# Patient Record
Sex: Male | Born: 1970 | Race: White | Hispanic: No | Marital: Married | Smoking: Current some day smoker
Health system: Southern US, Community
[De-identification: ages and names within clinical notes are randomized; demographics above are authoritative.]

## PROBLEM LIST (undated history)

## (undated) DIAGNOSIS — E7800 Pure hypercholesterolemia, unspecified: Secondary | ICD-10-CM

## (undated) DIAGNOSIS — I319 Disease of pericardium, unspecified: Secondary | ICD-10-CM

## (undated) DIAGNOSIS — I10 Essential (primary) hypertension: Secondary | ICD-10-CM

## (undated) HISTORY — PX: TONSILLECTOMY: SUR1361

## (undated) HISTORY — PX: OTHER SURGICAL HISTORY: SHX169

---

## 2016-05-29 DIAGNOSIS — I319 Disease of pericardium, unspecified: Secondary | ICD-10-CM

## 2016-05-29 HISTORY — DX: Disease of pericardium, unspecified: I31.9

## 2016-09-25 ENCOUNTER — Observation Stay (HOSPITAL_BASED_OUTPATIENT_CLINIC_OR_DEPARTMENT_OTHER)
Admission: EM | Admit: 2016-09-25 | Discharge: 2016-09-27 | Disposition: A | Discharge status: Home / Self Care | Payer: Managed Care, Other (non HMO) | Attending: Internal Medicine | Admitting: Internal Medicine

## 2016-09-25 ENCOUNTER — Emergency Department (HOSPITAL_BASED_OUTPATIENT_CLINIC_OR_DEPARTMENT_OTHER): Payer: Managed Care, Other (non HMO)

## 2016-09-25 ENCOUNTER — Encounter (HOSPITAL_BASED_OUTPATIENT_CLINIC_OR_DEPARTMENT_OTHER): Payer: Self-pay

## 2016-09-25 DIAGNOSIS — Z8249 Family history of ischemic heart disease and other diseases of the circulatory system: Secondary | ICD-10-CM | POA: Insufficient documentation

## 2016-09-25 DIAGNOSIS — I309 Acute pericarditis, unspecified: Secondary | ICD-10-CM | POA: Diagnosis not present

## 2016-09-25 DIAGNOSIS — E7800 Pure hypercholesterolemia, unspecified: Secondary | ICD-10-CM | POA: Diagnosis not present

## 2016-09-25 DIAGNOSIS — R110 Nausea: Secondary | ICD-10-CM | POA: Insufficient documentation

## 2016-09-25 DIAGNOSIS — R079 Chest pain, unspecified: Secondary | ICD-10-CM | POA: Diagnosis present

## 2016-09-25 DIAGNOSIS — E876 Hypokalemia: Secondary | ICD-10-CM | POA: Insufficient documentation

## 2016-09-25 DIAGNOSIS — F17210 Nicotine dependence, cigarettes, uncomplicated: Secondary | ICD-10-CM | POA: Insufficient documentation

## 2016-09-25 DIAGNOSIS — I10 Essential (primary) hypertension: Secondary | ICD-10-CM | POA: Diagnosis present

## 2016-09-25 DIAGNOSIS — E785 Hyperlipidemia, unspecified: Secondary | ICD-10-CM | POA: Diagnosis present

## 2016-09-25 HISTORY — DX: Pure hypercholesterolemia, unspecified: E78.00

## 2016-09-25 HISTORY — DX: Essential (primary) hypertension: I10

## 2016-09-25 LAB — CBC WITH DIFFERENTIAL/PLATELET
Basophils Absolute: 0 10*3/uL (ref 0.0–0.1)
Basophils Relative: 1 %
Eosinophils Absolute: 0.1 10*3/uL (ref 0.0–0.7)
Eosinophils Relative: 2 %
HEMATOCRIT: 42.1 % (ref 39.0–52.0)
HEMOGLOBIN: 14 g/dL (ref 13.0–17.0)
Lymphocytes Relative: 16 %
Lymphs Abs: 1.2 10*3/uL (ref 0.7–4.0)
MCH: 23.6 pg — AB (ref 26.0–34.0)
MCHC: 33.3 g/dL (ref 30.0–36.0)
MCV: 70.9 fL — AB (ref 78.0–100.0)
MONOS PCT: 17 %
Monocytes Absolute: 1.2 10*3/uL — ABNORMAL HIGH (ref 0.1–1.0)
NEUTROS PCT: 64 %
Neutro Abs: 4.6 10*3/uL (ref 1.7–7.7)
Platelets: 213 10*3/uL (ref 150–400)
RBC: 5.94 MIL/uL — ABNORMAL HIGH (ref 4.22–5.81)
RDW: 16.5 % — ABNORMAL HIGH (ref 11.5–15.5)
WBC: 7.1 10*3/uL (ref 4.0–10.5)

## 2016-09-25 LAB — BASIC METABOLIC PANEL
Anion gap: 9 (ref 5–15)
BUN: 16 mg/dL (ref 6–20)
CHLORIDE: 97 mmol/L — AB (ref 101–111)
CO2: 30 mmol/L (ref 22–32)
CREATININE: 1.09 mg/dL (ref 0.61–1.24)
Calcium: 9.8 mg/dL (ref 8.9–10.3)
GFR calc Af Amer: >60 mL/min (ref >60)
GFR calc non Af Amer: >60 mL/min (ref >60)
GLUCOSE: 184 mg/dL — AB (ref 65–99)
Potassium: 3.6 mmol/L (ref 3.5–5.1)
SODIUM: 136 mmol/L (ref 135–145)

## 2016-09-25 LAB — TROPONIN I: Troponin I: <0.03 ng/mL (ref <0.03)

## 2016-09-25 MED ORDER — ASPIRIN 81 MG PO CHEW
324.0000 mg | CHEWABLE_TABLET | Freq: Once | ORAL | Status: AC
  Administered 2016-09-25: 324 mg via ORAL
  Filled 2016-09-25: qty 4

## 2016-09-25 MED ORDER — MORPHINE SULFATE (PF) 4 MG/ML IV SOLN
4.0000 mg | Freq: Once | INTRAVENOUS | Status: AC
  Administered 2016-09-26: 4 mg via INTRAVENOUS
  Filled 2016-09-25: qty 1

## 2016-09-25 MED ORDER — NITROGLYCERIN 0.4 MG SL SUBL
0.4000 mg | SUBLINGUAL_TABLET | SUBLINGUAL | Status: DC | PRN
  Administered 2016-09-25: 0.4 mg via SUBLINGUAL
  Filled 2016-09-25: qty 1

## 2016-09-25 MED ORDER — MORPHINE SULFATE (PF) 4 MG/ML IV SOLN
4.0000 mg | Freq: Once | INTRAVENOUS | Status: AC
  Administered 2016-09-25: 4 mg via INTRAVENOUS
  Filled 2016-09-25: qty 1

## 2016-09-25 MED ORDER — GI COCKTAIL ~~LOC~~
30.0000 mL | Freq: Once | ORAL | Status: AC
  Administered 2016-09-26: 30 mL via ORAL
  Filled 2016-09-25: qty 30

## 2016-09-25 NOTE — ED Provider Notes (Signed)
[facility] Provider Note   CSN: [id number] Arrival date & time: 09/25/16  2158  By signing my name below, I, Freida Busman, attest that this documentation has been prepared under the direction and in the presence of Roxy Horseman, PA-C. Electronically Signed: Freida Busman, Scribe. 09/25/2016. 10:59 PM.  History   Chief Complaint Chief Complaint  Patient presents with  . Chest Pain    The history is provided by the patient. No language interpreter was used.     HPI Comments:  Mark Powell is a 46 y.o. male with a history of HTN and HLD, who presents to the Emergency Department complaining of gradually worsening, non-radiating, central CP that began ~ 1500 today. He describes his pain as a tightness. Pr reports associated SOB, mild DOE, HA, and nausea. He denies vomiting and LE swelling. No h/o same. No ASA taken PTA. No alleviating factors noted. He also denies h/o asthma/COPD and PE/DVT. Pt smokes less than 1 ppd.  Pt reports FHx of MI <55 yrs old--his older brother at age 28.    Past Medical History:  Diagnosis Date  . High cholesterol   . Hypertension     There are no active problems to display for this patient.   Past Surgical History:  Procedure Laterality Date  . arm surgery      Home Medications    Prior to Admission medications   Medication Sig Start Date End Date Taking? Authorizing Provider  atorvastatin (LIPITOR) 10 MG tablet Take 10 mg by mouth daily.   Yes Historical Provider, MD  losartan-hydrochlorothiazide (HYZAAR) 50-12.5 MG tablet Take 1 tablet by mouth daily.   Yes Historical Provider, MD  Vitamin D, Ergocalciferol, (DRISDOL) 50000 units CAPS capsule Take 50,000 Units by mouth every 7 (seven) days.   Yes Historical Provider, MD    Family History No family history on file.  Social History Social History  Substance Use Topics  . Smoking status: Current Every Day Smoker  . Smokeless tobacco: Never Used  . Alcohol use Yes   Comment: daily     Allergies   Patient has no known allergies.   Review of Systems Review of Systems  Respiratory: Positive for shortness of breath.   Cardiovascular: Positive for chest pain. Negative for leg swelling.  Gastrointestinal: Positive for nausea. Negative for vomiting.  Neurological: Positive for headaches.  All other systems reviewed and are negative.  Physical Exam Updated Vital Signs BP 122/77 (BP Location: Left Arm)   Pulse 94   Temp 97.9 F (36.6 C) (Oral)   Resp 20   Ht  (1.88 m)   Wt 180 lb (81.6 kg)   SpO2 99%   BMI 23.11 kg/m   Physical Exam  Constitutional: He is oriented to person, place, and time. He appears well-developed and well-nourished. No distress.  HENT:  Head: Normocephalic and atraumatic.  Eyes: Conjunctivae and EOM are normal.  Neck: Normal range of motion.  Cardiovascular: Normal rate, regular rhythm, normal heart sounds and intact distal pulses.   Pulmonary/Chest: Effort normal and breath sounds normal. No respiratory distress.  Abdominal: Soft. He exhibits no distension. There is no tenderness.  Musculoskeletal: Normal range of motion.  Neurological: He is alert and oriented to person, place, and time.  Skin: Skin is warm and dry.  Psychiatric: He has a normal mood and affect. Judgment normal.  Nursing note and vitals reviewed.   ED Treatments / Results  DIAGNOSTIC STUDIES:  Oxygen Saturation is 99% on RA, normal by my  interpretation.    COORDINATION OF CARE:  10:58 PM Discussed treatment plan with pt at bedside and pt agreed to plan.  Labs (all labs ordered are listed, but only abnormal results are displayed) Labs Reviewed - No data to display  EKG  EKG Interpretation  Date/Time:  Monday September 25 2016 22:10:50 EDT Ventricular Rate:  95 PR Interval:  140 QRS Duration: 82 QT Interval:  352 QTC Calculation: 442 R Axis:   76 Text Interpretation:  Normal sinus rhythm Confirmed by [facility]  MD, APRIL  ([id number]) on 09/25/2016 11:44:15 PM       Radiology Dg Chest 2 View  Result Date: 09/25/2016 CLINICAL DATA:  Left-sided chest pain EXAM: CHEST  2 VIEW COMPARISON:  None. FINDINGS: The heart size and mediastinal contours are within normal limits. Both lungs are clear. The visualized skeletal structures are unremarkable. IMPRESSION: No active cardiopulmonary disease. Electronically Signed   By: Jasmine Pang M.D.   On: 09/25/2016 22:41    Procedures Procedures (including critical care time)  Medications Ordered in ED Medications - No data to display   Initial Impression / Assessment and Plan / ED Course  I have reviewed the triage vital signs and the nursing notes.  Pertinent labs & imaging results that were available during my care of the patient were reviewed by me and considered in my medical decision making (see chart for details).     Patient with chest pain which is central, associated shortness of breath, worse with exertion, associated nausea. Symptoms have now resolved with treatment with aspirin, morphine, nitroglycerin. I do not suspect PE. Patient is PERC negative.  He has a heart score of 4. Cardiac risk factors are hypertension, hyperlipidemia, smoking history, and early family history of coronary artery disease. He has a negative troponin and his EKG shows no ischemic changes, however given risk factors and history, feel that cardiac rule out is indicated. Patient seen by and discussed with Dr. Nicanor Alcon, who agrees with the plan.  Appreciate Dr. Kara Pacer for accepting patient to [facility].  Final Clinical Impressions(s) / ED Diagnoses   Final diagnoses:  Chest pain with high risk for cardiac etiology    New Prescriptions New Prescriptions   No medications on file   I personally performed the services described in this documentation, which was scribed in my presence. The recorded information has been reviewed and is accurate.       Roxy Horseman, PA-C 09/26/16  0021    Roxy Horseman, PA-C 09/26/16 0022    Cy Blamer, MD 09/26/16 [phone]

## 2016-09-25 NOTE — ED Notes (Signed)
2nd NTG held due to BP, fluids hung and MD notified

## 2016-09-25 NOTE — ED Notes (Signed)
Pt not in room, pt in xray.  

## 2016-09-25 NOTE — ED Triage Notes (Addendum)
c/o CP/SOB-pain worse with deep breaths-NAD-steady gait

## 2016-09-26 ENCOUNTER — Encounter (HOSPITAL_COMMUNITY): Payer: Self-pay | Admitting: Internal Medicine

## 2016-09-26 ENCOUNTER — Encounter (HOSPITAL_COMMUNITY)
Admission: EM | Disposition: A | Discharge status: Home / Self Care | Payer: Self-pay | Source: Home / Self Care | Attending: Emergency Medicine

## 2016-09-26 DIAGNOSIS — R079 Chest pain, unspecified: Secondary | ICD-10-CM

## 2016-09-26 DIAGNOSIS — I200 Unstable angina: Secondary | ICD-10-CM | POA: Diagnosis not present

## 2016-09-26 DIAGNOSIS — I10 Essential (primary) hypertension: Secondary | ICD-10-CM | POA: Diagnosis present

## 2016-09-26 DIAGNOSIS — R072 Precordial pain: Secondary | ICD-10-CM

## 2016-09-26 DIAGNOSIS — E7800 Pure hypercholesterolemia, unspecified: Secondary | ICD-10-CM | POA: Diagnosis not present

## 2016-09-26 DIAGNOSIS — E785 Hyperlipidemia, unspecified: Secondary | ICD-10-CM

## 2016-09-26 HISTORY — PX: LEFT HEART CATH AND CORONARY ANGIOGRAPHY: CATH118249

## 2016-09-26 LAB — HIV ANTIBODY (ROUTINE TESTING W REFLEX): HIV Screen 4th Generation wRfx: NONREACTIVE

## 2016-09-26 LAB — PROTIME-INR
INR: 1.07
Prothrombin Time: 13.9 seconds (ref 11.4–15.2)

## 2016-09-26 LAB — TROPONIN I
Troponin I: <0.03 ng/mL (ref <0.03)
Troponin I: <0.03 ng/mL (ref <0.03)

## 2016-09-26 SURGERY — LEFT HEART CATH AND CORONARY ANGIOGRAPHY
Anesthesia: LOCAL

## 2016-09-26 MED ORDER — ACETAMINOPHEN 325 MG PO TABS
650.0000 mg | ORAL_TABLET | ORAL | Status: DC | PRN
  Administered 2016-09-26: 650 mg via ORAL
  Filled 2016-09-26: qty 2

## 2016-09-26 MED ORDER — MIDAZOLAM HCL 2 MG/2ML IJ SOLN
INTRAMUSCULAR | Status: AC
  Filled 2016-09-26: qty 2

## 2016-09-26 MED ORDER — HEPARIN SODIUM (PORCINE) 1000 UNIT/ML IJ SOLN
INTRAMUSCULAR | Status: DC | PRN
  Administered 2016-09-26: 4000 [IU] via INTRAVENOUS

## 2016-09-26 MED ORDER — MIDAZOLAM HCL 2 MG/2ML IJ SOLN
INTRAMUSCULAR | Status: DC | PRN
  Administered 2016-09-26: 2 mg via INTRAVENOUS

## 2016-09-26 MED ORDER — FENTANYL CITRATE (PF) 100 MCG/2ML IJ SOLN
INTRAMUSCULAR | Status: DC | PRN
  Administered 2016-09-26: 50 ug via INTRAVENOUS

## 2016-09-26 MED ORDER — SODIUM CHLORIDE 0.9% FLUSH
3.0000 mL | INTRAVENOUS | Status: DC | PRN
Start: 2016-09-26 — End: 2016-09-26

## 2016-09-26 MED ORDER — FENTANYL CITRATE (PF) 100 MCG/2ML IJ SOLN
INTRAMUSCULAR | Status: AC
  Filled 2016-09-26: qty 2

## 2016-09-26 MED ORDER — VERAPAMIL HCL 2.5 MG/ML IV SOLN
INTRAVENOUS | Status: DC | PRN
  Administered 2016-09-26: 18:00:00 via INTRA_ARTERIAL

## 2016-09-26 MED ORDER — SODIUM CHLORIDE 0.9% FLUSH: 3.0000 mL | Freq: Two times a day (BID) | INTRAVENOUS | Status: DC

## 2016-09-26 MED ORDER — SODIUM CHLORIDE 0.9 % WEIGHT BASED INFUSION: 1.0000 mL/kg/h | INTRAVENOUS | Status: DC

## 2016-09-26 MED ORDER — LIDOCAINE HCL (PF) 1 % IJ SOLN
INTRAMUSCULAR | Status: DC | PRN
  Administered 2016-09-26: 2 mL

## 2016-09-26 MED ORDER — SODIUM CHLORIDE 0.9 % IV SOLN: 250.0000 mL | INTRAVENOUS | Status: DC | PRN

## 2016-09-26 MED ORDER — ATORVASTATIN CALCIUM 10 MG PO TABS: 10.0000 mg | ORAL_TABLET | Freq: Every day | ORAL | Status: DC

## 2016-09-26 MED ORDER — SODIUM CHLORIDE 0.9% FLUSH: 3.0000 mL | INTRAVENOUS | Status: DC | PRN

## 2016-09-26 MED ORDER — SODIUM CHLORIDE 0.9 % WEIGHT BASED INFUSION: 3.0000 mL/kg/h | INTRAVENOUS | Status: DC

## 2016-09-26 MED ORDER — IOPAMIDOL (ISOVUE-370) INJECTION 76%
INTRAVENOUS | Status: DC | PRN
  Administered 2016-09-26: 75 mL via INTRA_ARTERIAL

## 2016-09-26 MED ORDER — ONDANSETRON HCL 4 MG/2ML IJ SOLN: 4.0000 mg | Freq: Four times a day (QID) | INTRAMUSCULAR | Status: DC | PRN

## 2016-09-26 MED ORDER — HYDROCHLOROTHIAZIDE 12.5 MG PO CAPS
12.5000 mg | ORAL_CAPSULE | Freq: Every day | ORAL | Status: DC
  Administered 2016-09-26 – 2016-09-27 (×2): 12.5 mg via ORAL
  Filled 2016-09-26 (×2): qty 1

## 2016-09-26 MED ORDER — ASPIRIN 81 MG PO CHEW: 81.0000 mg | CHEWABLE_TABLET | ORAL | Status: DC

## 2016-09-26 MED ORDER — CHLORHEXIDINE GLUCONATE 4 % EX LIQD
CUTANEOUS | Status: AC
  Filled 2016-09-26: qty 120

## 2016-09-26 MED ORDER — VERAPAMIL HCL 2.5 MG/ML IV SOLN
INTRAVENOUS | Status: AC
  Filled 2016-09-26: qty 2

## 2016-09-26 MED ORDER — ASPIRIN EC 81 MG PO TBEC
81.0000 mg | DELAYED_RELEASE_TABLET | Freq: Every day | ORAL | Status: DC
  Administered 2016-09-27: 81 mg via ORAL
  Filled 2016-09-26: qty 1

## 2016-09-26 MED ORDER — LOSARTAN POTASSIUM 50 MG PO TABS
50.0000 mg | ORAL_TABLET | Freq: Every day | ORAL | Status: DC
  Administered 2016-09-26 – 2016-09-27 (×3): 50 mg via ORAL
  Filled 2016-09-26 (×3): qty 1

## 2016-09-26 MED ORDER — HEPARIN (PORCINE) IN NACL 2-0.9 UNIT/ML-% IJ SOLN
INTRAMUSCULAR | Status: AC
  Filled 2016-09-26: qty 1000

## 2016-09-26 MED ORDER — HEPARIN SODIUM (PORCINE) 1000 UNIT/ML IJ SOLN
INTRAMUSCULAR | Status: AC
  Filled 2016-09-26: qty 1

## 2016-09-26 MED ORDER — ENOXAPARIN SODIUM 40 MG/0.4ML ~~LOC~~ SOLN
40.0000 mg | SUBCUTANEOUS | Status: DC
  Administered 2016-09-26 – 2016-09-27 (×2): 40 mg via SUBCUTANEOUS
  Filled 2016-09-26 (×2): qty 0.4

## 2016-09-26 MED ORDER — LIDOCAINE HCL (PF) 1 % IJ SOLN
INTRAMUSCULAR | Status: AC
  Filled 2016-09-26: qty 30

## 2016-09-26 MED ORDER — SODIUM CHLORIDE 0.9 % WEIGHT BASED INFUSION: 1.0000 mL/kg/h | INTRAVENOUS | Status: AC

## 2016-09-26 MED ORDER — LOSARTAN POTASSIUM-HCTZ 50-12.5 MG PO TABS: 1.0000 | ORAL_TABLET | Freq: Every day | ORAL | Status: DC

## 2016-09-26 MED ORDER — DIAZEPAM 5 MG PO TABS: 5.0000 mg | ORAL_TABLET | ORAL | Status: DC | PRN

## 2016-09-26 MED ORDER — ACETAMINOPHEN 325 MG PO TABS: 650.0000 mg | ORAL_TABLET | ORAL | Status: DC | PRN

## 2016-09-26 MED ORDER — HEPARIN (PORCINE) IN NACL 2-0.9 UNIT/ML-% IJ SOLN
INTRAMUSCULAR | Status: DC | PRN
  Administered 2016-09-26: 1000 mL

## 2016-09-26 SURGICAL SUPPLY — 11 items

## 2016-09-26 NOTE — Interval H&P Note (Signed)
History and Physical Interval Note:  09/26/2016 6:17 PM  Mark Powell  has presented today for surgery, with the diagnosis of cp  The various methods of treatment have been discussed with the patient and family. After consideration of risks, benefits and other options for treatment, the patient has consented to  Procedure(s): Left Heart Cath and Coronary Angiography (N/A) as a surgical intervention .  The patient's history has been reviewed, patient examined, no change in status, stable for surgery.  I have reviewed the patient's chart and labs.  Questions were answered to the patient's satisfaction.     [name]

## 2016-09-26 NOTE — Progress Notes (Signed)
Pt in cath holding via [facility]. BP 163/113, NSR at 89, 100% saturated on room air. Awaiting cardiac cath.

## 2016-09-26 NOTE — H&P (Signed)
History and Physical    Mark Powell ZOX:[id number] DOB: [birth year] DOA: 09/25/2016  PCP: [facility]  Patient coming from: Home  I have personally briefly reviewed patient's old medical records in [facility]  Chief Complaint: Chest pain  HPI: Mark Powell is a 46 y.o. male with medical history significant of HLD, HTN, smoking, brother with MI at age 57 or 27.  Patient presents to the ED at [facility] with c/o chest pain.  Sudden onset of pain in L chest at 3pm today.  Associated nausea and SOB, worse with exertion better at rest.  NTG dropped his blood pressure.  Got ASA, got morphine, CP resolved.     ED Course: Trop Nl.  No prior EKG available on system for comparison, but on my read the patient has T wave inversions in lead III and T wave flattening in AVF.   Review of Systems: As per HPI otherwise 10 point review of systems negative.   Past Medical History:  Diagnosis Date  . High cholesterol   . Hypertension     Past Surgical History:  Procedure Laterality Date  . arm surgery       reports that he has been smoking.  He has never used smokeless tobacco. He reports that he drinks alcohol. He reports that he uses drugs, including Marijuana.  No Known Allergies  Family History  Problem Relation Age of Onset  . Heart attack Brother 47     Prior to Admission medications   Medication Sig Start Date End Date Taking? Authorizing Provider  atorvastatin (LIPITOR) 10 MG tablet Take 10 mg by mouth daily.   Yes Historical Provider, MD  losartan-hydrochlorothiazide (HYZAAR) 50-12.5 MG tablet Take 1 tablet by mouth daily.   Yes Historical Provider, MD  Vitamin D, Ergocalciferol, (DRISDOL) 50000 units CAPS capsule Take 50,000 Units by mouth every 7 (seven) days.   Yes Historical Provider, MD    Physical Exam: Vitals:   09/26/16 0130 09/26/16 0215 09/26/16 0258 09/26/16 0359  BP: 130/89 (!) 132/93 114/84 124/84  Pulse: 87 91 90 84  Resp: Temp:   98.1 F (36.7 C)  98.6 F (37 C)  TempSrc:  Oral  Oral  SpO2: 97% 97% 98% 100%  Weight:      Height:        Constitutional: NAD, calm, comfortable Eyes: PERRL, lids and conjunctivae normal ENMT: Mucous membranes are moist. Posterior pharynx clear of any exudate or lesions.Normal dentition.  Neck: normal, supple, no masses, no thyromegaly Respiratory: clear to auscultation bilaterally, no wheezing, no crackles. Normal respiratory effort. No accessory muscle use.  Cardiovascular: Regular rate and rhythm, no murmurs / rubs / gallops. No extremity edema. 2+ pedal pulses. No carotid bruits.  Abdomen: no tenderness, no masses palpated. No hepatosplenomegaly. Bowel sounds positive.  Musculoskeletal: no clubbing / cyanosis. No joint deformity upper and lower extremities. Good ROM, no contractures. Normal muscle tone.  Skin: no rashes, lesions, ulcers. No induration Neurologic: CN 2-12 grossly intact. Sensation intact, DTR normal. Strength 5/5 in all 4.  Psychiatric: Normal judgment and insight. Alert and oriented x 3. Normal mood.    Labs on Admission: I have personally reviewed following labs and imaging studies  CBC:  Recent Labs Lab 09/25/16 2253  WBC 7.1  NEUTROABS 4.6  HGB 14.0  HCT 42.1  MCV 70.9*  PLT 213   Basic Metabolic Panel:  Recent Labs Lab 09/25/16 2253  NA 136  K 3.6  CL 97*  CO2 30  GLUCOSE 184*  BUN 16  CREATININE 1.09  CALCIUM 9.8   GFR: Estimated Creatinine Clearance: 98.8 mL/min (by C-G formula based on SCr of 1.09 mg/dL). Liver Function Tests: No results for input(s): AST, ALT, ALKPHOS, BILITOT, PROT, ALBUMIN in the last 168 hours. No results for input(s): LIPASE, AMYLASE in the last 168 hours. No results for input(s): AMMONIA in the last 168 hours. Coagulation Profile: No results for input(s): INR, PROTIME in the last 168 hours. Cardiac Enzymes:  Recent Labs Lab 09/25/16 2254  TROPONINI <0.03   BNP (last 3 results) No results for  input(s): PROBNP in the last 8760 hours. HbA1C: No results for input(s): HGBA1C in the last 72 hours. CBG: No results for input(s): GLUCAP in the last 168 hours. Lipid Profile: No results for input(s): CHOL, HDL, LDLCALC, TRIG, CHOLHDL, LDLDIRECT in the last 72 hours. Thyroid Function Tests: No results for input(s): TSH, T4TOTAL, FREET4, T3FREE, THYROIDAB in the last 72 hours. Anemia Panel: No results for input(s): VITAMINB12, FOLATE, FERRITIN, TIBC, IRON, RETICCTPCT in the last 72 hours. Urine analysis: No results found for: COLORURINE, APPEARANCEUR, LABSPEC, PHURINE, GLUCOSEU, HGBUR, BILIRUBINUR, KETONESUR, PROTEINUR, UROBILINOGEN, NITRITE, LEUKOCYTESUR  Radiological Exams on Admission: Dg Chest 2 View  Result Date: 09/25/2016 CLINICAL DATA:  Left-sided chest pain EXAM: CHEST  2 VIEW COMPARISON:  None. FINDINGS: The heart size and mediastinal contours are within normal limits. Both lungs are clear. The visualized skeletal structures are unremarkable. IMPRESSION: No active cardiopulmonary disease. Electronically Signed   By: Jasmine Pang M.D.   On: 09/25/2016 22:41    EKG: Independently reviewed. Patient appears to have T wave inversions in lead III and T wave flattening in AVF  Assessment/Plan Principal Problem:   Chest pain Active Problems:   HLD (hyperlipidemia)   HTN (hypertension)    1. Chest pain - 1. CP obs pathway 2. Tele monitor 3. Serial trops 4. NPO 5. Call cards in AM re: stress test vs heart cath 6. Heart score is 5 by my math, (giving him an additional point for the EKG findings as noted above). 2. HTN - continue home BP med 3. HLD - continue statin  DVT prophylaxis: Lovenox Code Status: Full Family Communication: No family in room Disposition Plan: Home after admit Consults called: None Admission status: Place in obs   GARDNER, Heywood Iles. DO [facility] Pager [phone]  If 7AM-7PM, please contact day team taking care of  patient [email or web address] Password [email or web address]  09/26/2016, 4:09 AM

## 2016-09-26 NOTE — Progress Notes (Signed)
TR BAND REMOVAL  LOCATION:    right radial  DEFLATED PER PROTOCOL:    Yes.    TIME BAND OFF / DRESSING APPLIED:    21:30   SITE UPON ARRIVAL:    Level 0  SITE AFTER BAND REMOVAL:    Level 0  CIRCULATION SENSATION AND MOVEMENT:    Within Normal Limits  YES   COMMENTS:   Post TR band instructions given. Pt tolerated well.

## 2016-09-26 NOTE — Consult Note (Signed)
Patient ID: Mckinnon Glick MRN: [id number], DOB/AGE: Nov 04, 1970   Admit date: 09/25/2016  Reason for Consult: Chest Pain  Requesting Physician: Dr. Lyda Perone, Internal Medicine    Primary Physician: [facility] Primary Cardiologist: [facility] (Dr. Mayford Knife)   Pt. Profile:  Mark Powell is a 46 y.o. male with multiple cardiac risk factors including h/o HTN, HLD, tobacco abuse x 30 years and strong family h/o CAD (brother with MI in late 24s) who is being seen today for the evaluation of chest pain at the request of Dr. Lyda Perone, Internal Medicine.   Problem List  Past Medical History:  Diagnosis Date  . High cholesterol   . Hypertension     Past Surgical History:  Procedure Laterality Date  . arm surgery       Allergies  No Known Allergies  HPI  Mark Powell is a 46 y.o. male with multiple cardiac risk factors including h/o HTN, HLD, tobacco abuse x 30 years and strong family h/o CAD (brother with MI in late 58s) who is being seen today for the evaluation of chest pain at the request of Dr. Lyda Perone, Internal Medicine.   He admits that he started smoking at the age of 84. Smokes 1/2 ppd. He takes meds for BP and cholesterol and reports full compliance. No known h/o DM. He works as a Emergency planning/management officer.    He was in his usual state of health until yesterday afternoon. He had lunch, [place]. Was at work and several hours later around 3:30 he developed resting SSCP described as pressure/tightness like someone sitting on his chest. Radiated over the the left side. He had associated diaphoresis and nausea w/o vomiting and later developed exertional dyspnea. His CP also worsened some with exertion. He has never had pain like this before. Symptoms persisted, prompting him to come to the ED. On arrival, EKG showed NSR with TWI in lead III. No prior EKGs for comparison. He was given ASA, SL NTG and morphine and pain improved, however he has had recurrence.  Currently with 6/10 CP. Cardiac enzymes are negative x 3. RN obtaining repeat EKG now.   Home Medications  Prior to Admission medications   Medication Sig Start Date End Date Taking? Authorizing Provider  atorvastatin (LIPITOR) 10 MG tablet Take 10 mg by mouth daily.   Yes Historical Provider, MD  losartan-hydrochlorothiazide (HYZAAR) 50-12.5 MG tablet Take 1 tablet by mouth daily.   Yes Historical Provider, MD  Vitamin D, Ergocalciferol, (DRISDOL) 50000 units CAPS capsule Take 50,000 Units by mouth every 7 (seven) days.   Yes Historical Provider, MD    Hospital Medications  . atorvastatin  10 mg Oral q1800  . enoxaparin (LOVENOX) injection  40 mg Subcutaneous Q24H  . losartan  50 mg Oral Daily   And  . hydrochlorothiazide  12.5 mg Oral Daily    acetaminophen, nitroGLYCERIN, ondansetron (ZOFRAN) IV  Family History  Family History  Problem Relation Age of Onset  . Heart attack Brother 52    Social History  Social History   Social History  . Marital status: Divorced    Spouse name: N/A  . Number of children: N/A  . Years of education: N/A   Occupational History  . Not on file.   Social History Main Topics  . Smoking status: Current Every Day Smoker  . Smokeless tobacco: Never Used  . Alcohol use Yes     Comment: daily  . Drug use: Yes    Types: Marijuana  .  Sexual activity: Not on file   Other Topics Concern  . Not on file   Social History Narrative  . No narrative on file     Review of Systems General:  No chills, fever, night sweats or weight changes.  Cardiovascular:  + chest pain, + dyspnea on exertion, no edema, orthopnea, palpitations, paroxysmal nocturnal dyspnea. Dermatological: No rash, lesions/masses Respiratory: No cough, dyspnea Urologic: No hematuria, dysuria Abdominal:   No nausea, vomiting, diarrhea, bright red blood per rectum, melena, or hematemesis Neurologic:  No visual changes, wkns, changes in mental status. All other systems  reviewed and are otherwise negative except as noted above.  Physical Exam  Blood pressure 124/84, pulse 84, temperature 98.6 F (37 C), temperature source Oral, resp. rate 17, height  (1.88 m), weight 177 lb 11.1 oz (80.6 kg), SpO2 100 %.  General: Pleasant, NAD Psych: Normal affect. Neuro: Alert and oriented X 3. Moves all extremities spontaneously. HEENT: Normal  Neck: Supple without bruits or JVD. Lungs:  Resp regular and unlabored, CTA. Heart: RRR no s3, s4, or murmurs. Abdomen: Soft, non-tender, non-distended, BS + x 4.  Extremities: No clubbing, cyanosis or edema. DP/PT/Radials 2+ and equal bilaterally.  Labs  Troponin (Point of Care Test) No results for input(s): TROPIPOC in the last 72 hours.  Recent Labs  09/25/16 2254 09/26/16 0405 09/26/16 0759  TROPONINI <0.03 <0.03 <0.03   Lab Results  Component Value Date   WBC 7.1 09/25/2016   HGB 14.0 09/25/2016   HCT 42.1 09/25/2016   MCV 70.9 (L) 09/25/2016   PLT 213 09/25/2016    Recent Labs Lab 09/25/16 2253  NA 136  K 3.6  CL 97*  CO2 30  BUN 16  CREATININE 1.09  CALCIUM 9.8  GLUCOSE 184*   No results found for: CHOL, HDL, LDLCALC, TRIG No results found for: DDIMER   Radiology/Studies  Dg Chest 2 View  Result Date: 09/25/2016 CLINICAL DATA:  Left-sided chest pain EXAM: CHEST  2 VIEW COMPARISON:  None. FINDINGS: The heart size and mediastinal contours are within normal limits. Both lungs are clear. The visualized skeletal structures are unremarkable. IMPRESSION: No active cardiopulmonary disease. Electronically Signed   By: Jasmine Pang M.D.   On: 09/25/2016 22:41    ECG  NSR with TWI in lead III. No prior EKGs for comparison-- personally reviewed  Telemetry  NSR 92 -- personally reviewed    ASSESSMENT AND PLAN  1. Unstable Angina: Mark Powell is a 46 y.o. male with multiple cardiac risk factors including h/o HTN, HLD, tobacco abuse x 30 years and strong family h/o CAD (brother with MI  in late 72s) who is being seen today for the evaluation of chest pain at the request of Dr. Lyda Perone, Internal Medicine. Symptomology is concerning for unstable angina. New onset SSCP occurring at rest and worse with exertion. Also with exertional dyspnea. Initial EKG with NSR with TWI in lead III. No prior EKGs for comparison. Cardiac enzymes are negative x 3. We are obtaining repeat EKG currently given recurrent CP. Will given additional PRN SL NTG given good response in the ED last night. Given his risk factors and symptoms, would recommend definitive LHC. Keep NPO. May need to add IV heparin until cath.  Continue statin. Check FLP. Add ASA. Will need BB if CAD, if HR and BP allows. MD to assess and will provide further recommendations.     Signed, Robbie Lis, PA-C, [facility] 09/26/2016, 9:42 AM [facility] Pager: [phone]

## 2016-09-26 NOTE — Progress Notes (Signed)
  PROGRESS NOTE  Patient admitted earlier this morning. See H&P. Patient complains of bilateral chest pressure "like someone is standing on me." Also associated symptoms of nausea without vomiting, cold sweats, shortness of breath. This started yesterday, relieved with nitro/morphine/aspirin. He had a stress test about 5 years ago which were normal. Cardiology consulted.   Noralee Stain, DO [facility] [email or web address] Password [facility] 09/26/2016, 9:47 AM

## 2016-09-26 NOTE — H&P (View-Only) (Signed)
Patient ID: Mark Powell MRN: [id number], DOB/AGE: Nov 04, 1970   Admit date: 09/25/2016  Reason for Consult: Chest Pain  Requesting Physician: Dr. Lyda Perone, Internal Medicine    Primary Physician: [facility] Primary Cardiologist: [facility] (Dr. Mayford Knife)   Pt. Profile:  Mark Powell is a 46 y.o. male with multiple cardiac risk factors including h/o HTN, HLD, tobacco abuse x 30 years and strong family h/o CAD (brother with MI in late 24s) who is being seen today for the evaluation of chest pain at the request of Dr. Lyda Perone, Internal Medicine.   Problem List  Past Medical History:  Diagnosis Date  . High cholesterol   . Hypertension     Past Surgical History:  Procedure Laterality Date  . arm surgery       Allergies  No Known Allergies  HPI  Mark Powell is a 46 y.o. male with multiple cardiac risk factors including h/o HTN, HLD, tobacco abuse x 30 years and strong family h/o CAD (brother with MI in late 58s) who is being seen today for the evaluation of chest pain at the request of Dr. Lyda Perone, Internal Medicine.   He admits that he started smoking at the age of 84. Smokes 1/2 ppd. He takes meds for BP and cholesterol and reports full compliance. No known h/o DM. He works as a Emergency planning/management officer.    He was in his usual state of health until yesterday afternoon. He had lunch, [place]. Was at work and several hours later around 3:30 he developed resting SSCP described as pressure/tightness like someone sitting on his chest. Radiated over the the left side. He had associated diaphoresis and nausea w/o vomiting and later developed exertional dyspnea. His CP also worsened some with exertion. He has never had pain like this before. Symptoms persisted, prompting him to come to the ED. On arrival, EKG showed NSR with TWI in lead III. No prior EKGs for comparison. He was given ASA, SL NTG and morphine and pain improved, however he has had recurrence.  Currently with 6/10 CP. Cardiac enzymes are negative x 3. RN obtaining repeat EKG now.   Home Medications  Prior to Admission medications   Medication Sig Start Date End Date Taking? Authorizing Provider  atorvastatin (LIPITOR) 10 MG tablet Take 10 mg by mouth daily.   Yes Historical Provider, MD  losartan-hydrochlorothiazide (HYZAAR) 50-12.5 MG tablet Take 1 tablet by mouth daily.   Yes Historical Provider, MD  Vitamin D, Ergocalciferol, (DRISDOL) 50000 units CAPS capsule Take 50,000 Units by mouth every 7 (seven) days.   Yes Historical Provider, MD    Hospital Medications  . atorvastatin  10 mg Oral q1800  . enoxaparin (LOVENOX) injection  40 mg Subcutaneous Q24H  . losartan  50 mg Oral Daily   And  . hydrochlorothiazide  12.5 mg Oral Daily    acetaminophen, nitroGLYCERIN, ondansetron (ZOFRAN) IV  Family History  Family History  Problem Relation Age of Onset  . Heart attack Brother 52    Social History  Social History   Social History  . Marital status: Divorced    Spouse name: N/A  . Number of children: N/A  . Years of education: N/A   Occupational History  . Not on file.   Social History Main Topics  . Smoking status: Current Every Day Smoker  . Smokeless tobacco: Never Used  . Alcohol use Yes     Comment: daily  . Drug use: Yes    Types: Marijuana  .  Sexual activity: Not on file   Other Topics Concern  . Not on file   Social History Narrative  . No narrative on file     Review of Systems General:  No chills, fever, night sweats or weight changes.  Cardiovascular:  + chest pain, + dyspnea on exertion, no edema, orthopnea, palpitations, paroxysmal nocturnal dyspnea. Dermatological: No rash, lesions/masses Respiratory: No cough, dyspnea Urologic: No hematuria, dysuria Abdominal:   No nausea, vomiting, diarrhea, bright red blood per rectum, melena, or hematemesis Neurologic:  No visual changes, wkns, changes in mental status. All other systems  reviewed and are otherwise negative except as noted above.  Physical Exam  Blood pressure 124/84, pulse 84, temperature 98.6 F (37 C), temperature source Oral, resp. rate 17, height 6' 2" (1.88 m), weight 177 lb 11.1 oz (80.6 kg), SpO2 100 %.  General: Pleasant, NAD Psych: Normal affect. Neuro: Alert and oriented X 3. Moves all extremities spontaneously. HEENT: Normal  Neck: Supple without bruits or JVD. Lungs:  Resp regular and unlabored, CTA. Heart: RRR no s3, s4, or murmurs. Abdomen: Soft, non-tender, non-distended, BS + x 4.  Extremities: No clubbing, cyanosis or edema. DP/PT/Radials 2+ and equal bilaterally.  Labs  Troponin (Point of Care Test) No results for input(s): TROPIPOC in the last 72 hours.  Recent Labs  09/25/16 2254 09/26/16 0405 09/26/16 0759  TROPONINI <0.03 <0.03 <0.03   Lab Results  Component Value Date   WBC 7.1 09/25/2016   HGB 14.0 09/25/2016   HCT 42.1 09/25/2016   MCV 70.9 (L) 09/25/2016   PLT 213 09/25/2016    Recent Labs Lab 09/25/16 2253  NA 136  K 3.6  CL 97*  CO2 30  BUN 16  CREATININE 1.09  CALCIUM 9.8  GLUCOSE 184*   No results found for: CHOL, HDL, LDLCALC, TRIG No results found for: DDIMER   Radiology/Studies  Dg Chest 2 View  Result Date: 09/25/2016 CLINICAL DATA:  Left-sided chest pain EXAM: CHEST  2 VIEW COMPARISON:  None. FINDINGS: The heart size and mediastinal contours are within normal limits. Both lungs are clear. The visualized skeletal structures are unremarkable. IMPRESSION: No active cardiopulmonary disease. Electronically Signed   By: Kim  Fujinaga M.D.   On: 09/25/2016 22:41    ECG  NSR with TWI in lead III. No prior EKGs for comparison-- personally reviewed  Telemetry  NSR 92 -- personally reviewed    ASSESSMENT AND PLAN  1. Unstable Angina: Mark Powell is a 45 y.o. male with multiple cardiac risk factors including h/o HTN, HLD, tobacco abuse x 30 years and strong family h/o CAD (brother with MI  in late 40s) who is being seen today for the evaluation of chest pain at the request of Dr. Jared Gardner, Internal Medicine. Symptomology is concerning for unstable angina. New onset SSCP occurring at rest and worse with exertion. Also with exertional dyspnea. Initial EKG with NSR with TWI in lead III. No prior EKGs for comparison. Cardiac enzymes are negative x 3. We are obtaining repeat EKG currently given recurrent CP. Will given additional PRN SL NTG given good response in the ED last night. Given his risk factors and symptoms, would recommend definitive LHC. Keep NPO. May need to add IV heparin until cath.  Continue statin. Check FLP. Add ASA. Will need BB if CAD, if HR and BP allows. MD to assess and will provide further recommendations.     Signed, Brittainy Simmons, PA-C, MHS 09/26/2016, 9:42 AM [facility] Pager: [phone]  

## 2016-09-26 NOTE — Interval H&P Note (Signed)
Cath Lab Visit (complete for each Cath Lab visit)  Clinical Evaluation Leading to the Procedure:   ACS: No.  Non-ACS:    Anginal Classification: CCS III  Anti-ischemic medical therapy: Minimal Therapy (1 class of medications)  Non-Invasive Test Results: No non-invasive testing performed  Prior CABG: No previous CABG      History and Physical Interval Note:  09/26/2016 6:17 PM  Mark Powell  has presented today for surgery, with the diagnosis of cp  The various methods of treatment have been discussed with the patient and family. After consideration of risks, benefits and other options for treatment, the patient has consented to  Procedure(s): Left Heart Cath and Coronary Angiography (N/A) as a surgical intervention .  The patient's history has been reviewed, patient examined, no change in status, stable for surgery.  I have reviewed the patient's chart and labs.  Questions were answered to the patient's satisfaction.     Mark Powell

## 2016-09-26 NOTE — Plan of Care (Signed)
PCP [facility] 46 yo M hx of HTN HLD, tobacco abuse,brother with MI at 48  At 3 Pm sudden left chest pain with nausea and SOB Worse with exertion better with rest, Nitro dropped pressure Got Morphine and chest pain resolved Got aspirin  has normal trop No ECG changes  Chest pain free.  Vitals: 99.9 15  100%  82   142/94  Accepted for obs Tele bed  Mark Powell 12:20 AM

## 2016-09-27 ENCOUNTER — Observation Stay (HOSPITAL_BASED_OUTPATIENT_CLINIC_OR_DEPARTMENT_OTHER): Payer: Managed Care, Other (non HMO)

## 2016-09-27 ENCOUNTER — Encounter (HOSPITAL_COMMUNITY): Payer: Self-pay | Admitting: Cardiovascular Disease

## 2016-09-27 DIAGNOSIS — I300 Acute nonspecific idiopathic pericarditis: Secondary | ICD-10-CM

## 2016-09-27 DIAGNOSIS — E7800 Pure hypercholesterolemia, unspecified: Secondary | ICD-10-CM

## 2016-09-27 DIAGNOSIS — I10 Essential (primary) hypertension: Secondary | ICD-10-CM

## 2016-09-27 DIAGNOSIS — R079 Chest pain, unspecified: Secondary | ICD-10-CM | POA: Diagnosis not present

## 2016-09-27 DIAGNOSIS — R55 Syncope and collapse: Secondary | ICD-10-CM

## 2016-09-27 DIAGNOSIS — I309 Acute pericarditis, unspecified: Secondary | ICD-10-CM | POA: Diagnosis present

## 2016-09-27 LAB — CBC
HCT: 40 % (ref 39.0–52.0)
HEMOGLOBIN: 12.8 g/dL — AB (ref 13.0–17.0)
MCH: 23.2 pg — AB (ref 26.0–34.0)
MCHC: 32 g/dL (ref 30.0–36.0)
MCV: 72.5 fL — ABNORMAL LOW (ref 78.0–100.0)
Platelets: 203 10*3/uL (ref 150–400)
RBC: 5.52 MIL/uL (ref 4.22–5.81)
RDW: 14.9 % (ref 11.5–15.5)
WBC: 6.7 10*3/uL (ref 4.0–10.5)

## 2016-09-27 LAB — ECHOCARDIOGRAM COMPLETE
HEIGHTINCHES: 74 in
WEIGHTICAEL: 2857.16 [oz_av]

## 2016-09-27 LAB — BASIC METABOLIC PANEL
Anion gap: 8 (ref 5–15)
BUN: 13 mg/dL (ref 6–20)
CHLORIDE: 100 mmol/L — AB (ref 101–111)
CO2: 27 mmol/L (ref 22–32)
Calcium: 9 mg/dL (ref 8.9–10.3)
Creatinine, Ser: 0.83 mg/dL (ref 0.61–1.24)
GFR calc Af Amer: >60 mL/min (ref >60)
GFR calc non Af Amer: >60 mL/min (ref >60)
Glucose, Bld: 104 mg/dL — ABNORMAL HIGH (ref 65–99)
POTASSIUM: 3.2 mmol/L — AB (ref 3.5–5.1)
SODIUM: 135 mmol/L (ref 135–145)

## 2016-09-27 MED ORDER — IBUPROFEN 600 MG PO TABS: 600.0000 mg | ORAL_TABLET | Freq: Three times a day (TID) | ORAL | 0 refills | Status: AC

## 2016-09-27 MED ORDER — IBUPROFEN 200 MG PO TABS: 600.0000 mg | ORAL_TABLET | Freq: Three times a day (TID) | ORAL | Status: DC

## 2016-09-27 MED ORDER — COLCHICINE 0.6 MG PO TABS
0.6000 mg | ORAL_TABLET | Freq: Two times a day (BID) | ORAL | Status: DC
  Administered 2016-09-27: 0.6 mg via ORAL
  Filled 2016-09-27: qty 1

## 2016-09-27 MED ORDER — COLCHICINE 0.6 MG PO TABS: 0.6000 mg | ORAL_TABLET | Freq: Two times a day (BID) | ORAL | 3 refills | Status: DC

## 2016-09-27 MED ORDER — ASPIRIN 81 MG PO TBEC: 81.0000 mg | DELAYED_RELEASE_TABLET | Freq: Every day | ORAL | Status: DC

## 2016-09-27 MED ORDER — POTASSIUM CHLORIDE CRYS ER 20 MEQ PO TBCR
40.0000 meq | EXTENDED_RELEASE_TABLET | Freq: Once | ORAL | Status: AC
  Administered 2016-09-27: 08:00:00 40 meq via ORAL
  Filled 2016-09-27: qty 2

## 2016-09-27 NOTE — Discharge Summary (Signed)
Physician Discharge Summary  Mark Powell ZHY:[id number] DOB: 12/18/1970 DOA: 09/25/2016  PCP: [facility]  Admit date: 09/25/2016 Discharge date: 09/27/2016  Time spent: 65 minutes  Recommendations for Outpatient Follow-up:  1. Follow-up with [facility] in 1-2 weeks. On follow-up patient will need a comprehensive metabolic profile done to follow-up on electrolytes, renal function, LFTs. Patient blood pressure need to be reassessed. Risk factor modification will also need to be stressed. 2. Follow-up with Dr. Armanda Powell cardiology, office will call with an appointment time.   Discharge Diagnoses:  Principal Problem:   Chest pain Active Problems:   Acute pericarditis   HLD (hyperlipidemia)   HTN (hypertension)   Chest pain with high risk for cardiac etiology   Discharge Condition: Stable and improved  Diet recommendation: Heart healthy  Filed Weights   09/26/16 0429 09/26/16 1915 09/27/16 0518  Weight: 80.6 kg (177 lb 11.1 oz) 81 kg (178 lb 9.2 oz) 81 kg (178 lb 9.2 oz)    History of present illness:  Per Dr. Cooper Render is a 46 y.o. male with medical history significant of HLD, HTN, smoking, brother with MI at age 32 or 63.  Patient presented to the ED at [facility] with c/o chest pain.  Sudden onset of pain in L chest at 3pm today.  Associated nausea and SOB, worse with exertion better at rest.  NTG dropped his blood pressure.  Got ASA, got morphine, CP resolved.     ED Course: Trop Nl.  No prior EKG available on system for comparison, but on my read the patient has T wave inversions in lead III and T wave flattening in AVF.    Hospital Course:  #1 chest pain/probable acute pericarditis Patient is a 46 year old history of hypertension, hyperlipidemia, tobacco, family history of premature coronary artery disease and a brother age 28 of 27 presented to the ED with complaints of sudden onset left substernal chest pain with associated nausea  or shortness of breath worse with exertion and better at rest. Patient was given some nitroglycerin which dropped his blood pressure. Patient given aspirin and morphine with improvement with chest pain. EKG done showed some T-wave inversions in leads 2 and T wave flattening in aVF. Repeat EKG done showed diffuse EKG changes. Cardiology was consulted and patient was admitted placed on telemetry cardiac enzymes cycled which are negative 3. 2-D echo obtained with EF of 60%, mild LVH. Patient underwent cardiac catheterization with normal coronary arteries. Patient improved clinically. It was felt per cardiology that patient likely had an acute pericarditis. Patient was placed on ibuprofen 600 mg 3 times a day 2 weeks as well as colchicine 0.6 mg twice a day 3 months. Patient will follow-up with cardiology and outpatient setting.  #2 hypertension Patient was maintained on home regimen of losartan HCTZ. Outpatient follow-up.  #3 hyperlipidemia Patient was maintained on home regimen of statin.  #4 tobacco abuse Tobacco cessation.  #5 hypokalemia Repleted on day of discharge. Outpatient follow-up.  Procedures:  2 d echo 09/27/2016  CXR 09/25/2016  Cardiac catheterization 09/26/2016  Consultations:  Cardiology: Dr.Turner 09/26/2016  Discharge Exam: Vitals:   09/27/16 1128 09/27/16 1130  BP: 138/90 (!) 134/95  Pulse: 81   Resp: 19 17  Temp: 98.2 F (36.8 C)     General: NAD Cardiovascular: RRR Respiratory: CTAB  Discharge Instructions   Discharge Instructions    Diet - low sodium heart healthy    Complete by:  As directed    Increase activity  slowly    Complete by:  As directed      Current Discharge Medication List    START taking these medications   Details  aspirin EC 81 MG EC tablet Take 1 tablet (81 mg total) by mouth daily.    colchicine 0.6 MG tablet Take 1 tablet (0.6 mg total) by mouth 2 (two) times daily. Take for 3 months. Qty: 60 tablet, Refills: 3     ibuprofen (ADVIL,MOTRIN) 600 MG tablet Take 1 tablet (600 mg total) by mouth 3 (three) times daily. Qty: 42 tablet, Refills: 0      CONTINUE these medications which have NOT CHANGED   Details  atorvastatin (LIPITOR) 10 MG tablet Take 10 mg by mouth daily.    losartan-hydrochlorothiazide (HYZAAR) 50-12.5 MG tablet Take 1 tablet by mouth daily.    Vitamin D, Ergocalciferol, (DRISDOL) 50000 units CAPS capsule Take 50,000 Units by mouth every 7 (seven) days.       No Known Allergies Follow-up Information    Mark Magic, MD Follow up.   Specialty:  Cardiology Why:  our office will call you with a hospital follow-up with cardiology in 2 weeks  Contact information: [address] [place] [address] [phone]        [facility]. Schedule an appointment as soon as possible for a visit in 2 week(s).   Why:  f/u in 1-2 weeks. Contact information: [address] [place] [address] [phone]            The results of significant diagnostics from this hospitalization (including imaging, microbiology, ancillary and laboratory) are listed below for reference.    Significant Diagnostic Studies: Dg Chest 2 View  Result Date: 09/25/2016 CLINICAL DATA:  Left-sided chest pain EXAM: CHEST  2 VIEW COMPARISON:  None. FINDINGS: The heart size and mediastinal contours are within normal limits. Both lungs are clear. The visualized skeletal structures are unremarkable. IMPRESSION: No active cardiopulmonary disease. Electronically Signed   By: Jasmine Pang M.D.   On: 09/25/2016 22:41    Microbiology: No results found for this or any previous visit (from the past 240 hour(s)).   Labs: Basic Metabolic Panel:  Recent Labs Lab 09/25/16 2253 09/27/16 0203  NA 136 135  K 3.6 3.2*  CL 97* 100*  CO2 30 27  GLUCOSE 184* 104*  BUN 16 13  CREATININE 1.09 0.83  CALCIUM 9.8 9.0   Liver Function Tests: No results for input(s): AST, ALT, ALKPHOS,  BILITOT, PROT, ALBUMIN in the last 168 hours. No results for input(s): LIPASE, AMYLASE in the last 168 hours. No results for input(s): AMMONIA in the last 168 hours. CBC:  Recent Labs Lab 09/25/16 2253 09/27/16 0203  WBC 7.1 6.7  NEUTROABS 4.6  --   HGB 14.0 12.8*  HCT 42.1 40.0  MCV 70.9* 72.5*  PLT 213 203   Cardiac Enzymes:  Recent Labs Lab 09/25/16 2254 09/26/16 0405 09/26/16 0759 09/26/16 1008  TROPONINI <0.03 <0.03 <0.03 <0.03   BNP: BNP (last 3 results) No results for input(s): BNP in the last 8760 hours.  ProBNP (last 3 results) No results for input(s): PROBNP in the last 8760 hours.  CBG: No results for input(s): GLUCAP in the last 168 hours.     SignedRamiro Harvest MD.  [facility] 09/27/2016, 3:54 PM

## 2016-09-27 NOTE — Care Management Note (Signed)
Case Management Note  Patient Details  Name: Mark Powell MRN: [id number] Date of Birth: 01-Dec-1970  Subjective/Objective:   From home with girlfriend, s/p heart cath.  He has medication coverage and he has a PCP,and transport at dc.                 Action/Plan: PTA indep, NCM will follow for dc needs.  Expected Discharge Date:                  Expected Discharge Plan:  Home/Self Care  In-House Referral:     Discharge planning Services  CM Consult  Post Acute Care Choice:    Choice offered to:     DME Arranged:    DME Agency:     HH Arranged:    HH Agency:     Status of Service:  Completed, signed off  If discussed at [employer] of Stay Meetings, dates discussed:    Additional Comments:  [name], RN 09/27/2016, 9:22 AM

## 2016-09-27 NOTE — Progress Notes (Signed)
Progress Note  Patient Name: Mark Powell Date of Encounter: 09/27/2016  Primary Cardiologist: Dr. Mayford Knife   Subjective   Feels much better today. No current CP or dyspnea. He states he feels back to his baseline. No post cath complications. Right radial cath site is stable.   Inpatient Medications    Scheduled Meds: . aspirin EC  81 mg Oral Daily  . atorvastatin  10 mg Oral q1800  . enoxaparin (LOVENOX) injection  40 mg Subcutaneous Q24H  . losartan  50 mg Oral Daily   And  . hydrochlorothiazide  12.5 mg Oral Daily  . sodium chloride flush  3 mL Intravenous Q12H   Continuous Infusions: . sodium chloride     PRN Meds: sodium chloride, acetaminophen, diazepam, nitroGLYCERIN, ondansetron (ZOFRAN) IV, sodium chloride flush   Vital Signs    Vitals:   09/26/16 1915 09/26/16 2000 09/26/16 2217 09/27/16 0518  BP: (!) 174/108  132/79 (!) 160/103  Pulse: 82 (!) 44 83 75  Resp: Temp: 97.9 F (36.6 C)   98.2 F (36.8 C)  TempSrc: Oral   Oral  SpO2: 100% 99% 97% 98%  Weight: 178 lb 9.2 oz (81 kg)   178 lb 9.2 oz (81 kg)  Height:  (1.88 m)       Intake/Output Summary (Last 24 hours) at 09/27/16 0753 Last data filed at 09/27/16 0600  Gross per 24 hour  Intake           1485.7 ml  Output             1000 ml  Net            485.7 ml   Filed Weights   09/26/16 0429 09/26/16 1915 09/27/16 0518  Weight: 177 lb 11.1 oz (80.6 kg) 178 lb 9.2 oz (81 kg) 178 lb 9.2 oz (81 kg)    Telemetry    NSR - Personally Reviewed  ECG    09/26/16 diffuse ST elevations - Personally Reviewed  Physical Exam   GEN: No acute distress.   Neck: No JVD Cardiac: RRR, no murmurs, rubs, or gallops.  Respiratory: Clear to auscultation bilaterally. GI: Soft, nontender, non-distended  MS: No edema; No deformity. Neuro:  Nonfocal  Psych: Normal affect   Labs    Chemistry Recent Labs Lab 09/25/16 2253 09/27/16 0203  NA 136 135  K 3.6 3.2*  CL 97* 100*  CO2 30 27    GLUCOSE 184* 104*  BUN 16 13  CREATININE 1.09 0.83  CALCIUM 9.8 9.0  GFRNONAA >60 >60  GFRAA >60 >60  ANIONGAP 9 8     Hematology Recent Labs Lab 09/25/16 2253 09/27/16 0203  WBC 7.1 6.7  RBC 5.94* 5.52  HGB 14.0 12.8*  HCT 42.1 40.0  MCV 70.9* 72.5*  MCH 23.6* 23.2*  MCHC 33.3 32.0  RDW 16.5* 14.9  PLT 213 203    Cardiac Enzymes Recent Labs Lab 09/25/16 2254 09/26/16 0405 09/26/16 0759 09/26/16 1008  TROPONINI <0.03 <0.03 <0.03 <0.03   No results for input(s): TROPIPOC in the last 168 hours.   BNPNo results for input(s): BNP, PROBNP in the last 168 hours.   DDimer No results for input(s): DDIMER in the last 168 hours.   Radiology    Dg Chest 2 View  Result Date: 09/25/2016 CLINICAL DATA:  Left-sided chest pain EXAM: CHEST  2 VIEW COMPARISON:  None. FINDINGS: The heart size and mediastinal contours are within normal limits. Both lungs  are clear. The visualized skeletal structures are unremarkable. IMPRESSION: No active cardiopulmonary disease. Electronically Signed   By: Jasmine Pang M.D.   On: 09/25/2016 22:41    Cardiac Studies   LHC 09/26/16 Procedures   Left Heart Cath and Coronary Angiography  Conclusion     The left ventricular systolic function is normal.  LV end diastolic pressure is normal.   Normal coronary arteries.  Normal LV function.  RECOMMENDATION: The patient's chest pain is most likely nonischemic.  The patient will be evaluated for possible pericardial etiology to his chest pain.  Smoking cessation was recommended.     Patient Profile   46 y.o. male with multiple cardiac risk factors including h/o HTN, HLD, tobacco abuse x 30 years and strong family h/o CAD (brother with MI in late 59s), who presented initially to [facility] on 09/25/16 with complaint of CP and dyspnea with both typical and atypical features. Cardiac enzymes were negative. Admission EKG showed nonspecific ST abnormality and repeat EKG 5/1 showed diffuse ST  elevation more consistent with acute pericarditis. Given his significant cardiac risk factors for CAD and exertional chest pain, he was referred for diagnostic LHC and transferred to [facility]. Cath showed normal coronaries.   Assessment & Plan    1. Chest Pain: nonischemic CP. LHC 09/26/16 showed normal coronaries and normal LVEF. Given EKG showing diffuse ST elevations, etiology is presumed to be pericarditis most likely. He feels much better today. Pain resolved, however may consider brief course of NSAIDs for 1-2 weeks, along with colchicine to reduce risk of recurrence.   2. HTN: elevated today. Continue HCTZ and Losartan. Monitor. If still elevated after morning meds, consider increasing Losartan dose. He can f/u with his PCP.   3. HLD: continue statin. Followed by PCP  4. Tobacco Abuse: smoking cessation strongly advised for risk reduction  5. Post Cath: renal function and Hgb stable. Right radial cath site is stable with 2+ radial pulse.   6. Hypokalemia: K 3.2. Supplement with K-Dur.   Signed, Robbie Lis, PA-C  09/27/2016, 7:53 AM

## 2016-09-27 NOTE — Progress Notes (Signed)
  Echocardiogram 2D Echocardiogram has been performed.  Leta Jungling M 09/27/2016, 1:05 PM

## 2016-10-11 NOTE — Progress Notes (Signed)
Cardiology Office Note    Date:  10/12/2016   ID:  Mark Powell, DOB 08-26-1970, MRN [id number]  PCP:  [facility]  Cardiologist: Dr. Mayford Knifeurner  Chief Complaint  Patient presents with  . Hospitalization Follow-up    History of Present Illness:  Mark Powell is a 46 y.o. male with history of hypertension, HLD, tobacco abuse and strong family history of CAD Brother having an MI in his 3740s. He presented to the hospital with chest pain and dyspnea 09/25/16. EKG had diffuse ST elevation more consistent with acute pericarditis. Cardiac catheterization showed normal coronary arteries. He was started on ibuprofen 600 mg 3 times a day for 2 weeks as well as colchicine 0.6 mg twice a day for 3 months. Blood pressure was also elevated in the hospital and they were monitoring closely. 2-D echo showed normal LVEF 55-60% with mild LVH. No effusion.  Patient comes in today for follow-up. He feels so much better. He's had no further chest pain. He is cutting back on smoking and plans to quit at the end of the month. His blood pressure has been stable at home.    Past Medical History:  Diagnosis Date  . High cholesterol   . Hypertension     Past Surgical History:  Procedure Laterality Date  . arm surgery    . LEFT HEART CATH AND CORONARY ANGIOGRAPHY N/A 09/26/2016   Procedure: Left Heart Cath and Coronary Angiography;  Surgeon: Lennette Biharihomas A Kelly, MD;  [facility];  Service: Cardiovascular;  Laterality: N/A;    Current Medications: Outpatient Medications Prior to Visit  Medication Sig Dispense Refill  . aspirin EC 81 MG EC tablet Take 1 tablet (81 mg total) by mouth daily.    Marland Kitchen. atorvastatin (LIPITOR) 10 MG tablet Take 10 mg by mouth daily.    . colchicine 0.6 MG tablet Take 1 tablet (0.6 mg total) by mouth 2 (two) times daily. Take for 3 months. 60 tablet 3  . losartan-hydrochlorothiazide (HYZAAR) 50-12.5 MG tablet Take 1 tablet by mouth daily.    . Vitamin D,  Ergocalciferol, (DRISDOL) 50000 units CAPS capsule Take 50,000 Units by mouth every 7 (seven) days.     No facility-administered medications prior to visit.      Allergies:   Patient has no known allergies.   Social History   Social History  . Marital status: Divorced    Spouse name: N/A  . Number of children: N/A  . Years of education: N/A   Social History Main Topics  . Smoking status: Current Every Day Smoker  . Smokeless tobacco: Never Used  . Alcohol use Yes     Comment: daily  . Drug use: Yes    Types: Marijuana  . Sexual activity: Not Asked   Other Topics Concern  . None   Social History Narrative  . None     Family History:  The patient's   family history includes Heart attack (age of onset: 3047) in his brother.   ROS:   Please see the history of present illness.    Review of Systems  Constitution: Negative.  HENT: Negative.   Cardiovascular: Negative.   Respiratory: Negative.   Endocrine: Negative.   Hematologic/Lymphatic: Negative.   Musculoskeletal: Negative.   Gastrointestinal: Negative.   Genitourinary: Negative.   Neurological: Negative.    All other systems reviewed and are negative.   PHYSICAL EXAM:   VS:  BP 122/86   Pulse 86   Ht 6\' 2"  (  1.88 m)   Wt 177 lb 1.9 oz (80.3 kg)   SpO2 99%   BMI 22.74 kg/m   Physical Exam  GEN: Well nourished, well developed, in no acute distress  Neck: no JVD, carotid bruits, or masses Cardiac:RRR; no murmurs, rubs, or gallops  Respiratory:  clear to auscultation bilaterally, normal work of breathing GI: soft, nontender, nondistended, + BS Ext: Right arm and cath site without hematoma or hemorrhage otherwise lower extremities without cyanosis, clubbing, or edema, Good distal pulses bilaterally Neuro:  Alert and Oriented x 3 Psych: euthymic mood, full affect  Wt Readings from Last 3 Encounters:  10/12/16 177 lb 1.9 oz (80.3 kg)  09/27/16 178 lb 9.2 oz (81 kg)      Studies/Labs Reviewed:   EKG:  EKG  is  ordered today.  The ekg ordered today demonstrates Normal sinus rhythm with ST elevation minimal, similar to EKG in the hospital.  Recent Labs: 09/27/2016: BUN 13; Creatinine, Ser 0.83; Hemoglobin 12.8; Platelets 203; Potassium 3.2; Sodium 135   Lipid Panel No results found for: CHOL, TRIG, HDL, CHOLHDL, VLDL, LDLCALC, LDLDIRECT  Additional studies/ records that were reviewed today include:  2-D echo 09/27/16 Study Conclusions   - Left ventricle: The cavity size was normal. Wall thickness was   increased in a pattern of mild LVH. Systolic function was normal.   The estimated ejection fraction was in the range of 55% to 60%.   Wall motion was normal; there were no regional wall motion   abnormalities. Left ventricular diastolic function parameters   were normal. - Atrial septum: No defect or patent foramen ovale was identified. LHC 09/26/16 Procedures    Left Heart Cath and Coronary Angiography  Conclusion       The left ventricular systolic function is normal.  LV end diastolic pressure is normal.   Normal coronary arteries.   Normal LV function.   RECOMMENDATION: The patient's chest pain is most likely nonischemic.  The patient will be evaluated for possible pericardial etiology to his chest pain.  Smoking cessation was recommended.      ASSESSMENT:    1. Acute pericarditis, unspecified type   2. Essential hypertension   3. Pure hypercholesterolemia   4. Hypokalemia      PLAN:  In order of problems listed above:  Acute pericarditis improving with colchicine. Patient's only taken it once a day. Recommend increasing it to twice a day and continue for a total of 3 months. He has finished his 2 weeks of ibuprofen. Follow-up with Dr. Mayford Knife in 2 months.  Hypertension well controlled on Hyzaar  Hypercholesterolemia on Lipitor  Hypokalemia in the hospital will follow-up cmet today   Medication Adjustments/Labs and Tests Ordered: Current medicines are reviewed at  length with the patient today.  Concerns regarding medicines are outlined above.  Medication changes, Labs and Tests ordered today are listed in the Patient Instructions below. There are no Patient Instructions on file for this visit.   Elson Clan, PA-C  10/12/2016 8:46 AM    [facility] [address] [place], [place], [place]  [address] Phone: [phone]; Fax: [phone]

## 2016-10-12 ENCOUNTER — Encounter: Payer: Self-pay | Admitting: Physician Assistant

## 2016-10-12 ENCOUNTER — Ambulatory Visit (INDEPENDENT_AMBULATORY_CARE_PROVIDER_SITE_OTHER): Payer: Managed Care, Other (non HMO) | Admitting: Physician Assistant

## 2016-10-12 VITALS — BP 122/86 | HR 86 | Ht 74.0 in | Wt 177.1 lb

## 2016-10-12 DIAGNOSIS — I10 Essential (primary) hypertension: Secondary | ICD-10-CM | POA: Diagnosis not present

## 2016-10-12 DIAGNOSIS — I309 Acute pericarditis, unspecified: Secondary | ICD-10-CM

## 2016-10-12 DIAGNOSIS — E876 Hypokalemia: Secondary | ICD-10-CM | POA: Diagnosis not present

## 2016-10-12 DIAGNOSIS — E7800 Pure hypercholesterolemia, unspecified: Secondary | ICD-10-CM | POA: Diagnosis not present

## 2016-10-12 LAB — COMPREHENSIVE METABOLIC PANEL
A/G RATIO: 1.9 (ref 1.2–2.2)
ALT: 99 IU/L — ABNORMAL HIGH (ref 0–44)
AST: 61 IU/L — ABNORMAL HIGH (ref 0–40)
Albumin: 4.8 g/dL (ref 3.5–5.5)
Alkaline Phosphatase: 36 IU/L — ABNORMAL LOW (ref 39–117)
BUN/Creatinine Ratio: 20 (ref 9–20)
BUN: 17 mg/dL (ref 6–24)
Bilirubin Total: <0.2 mg/dL (ref 0.0–1.2)
CALCIUM: 9.6 mg/dL (ref 8.7–10.2)
CO2: 20 mmol/L (ref 18–29)
Chloride: 98 mmol/L (ref 96–106)
Creatinine, Ser: 0.84 mg/dL (ref 0.76–1.27)
GFR, EST AFRICAN AMERICAN: 122 mL/min/{1.73_m2} (ref >59)
GFR, EST NON AFRICAN AMERICAN: 106 mL/min/{1.73_m2} (ref >59)
GLOBULIN, TOTAL: 2.5 g/dL (ref 1.5–4.5)
Glucose: 97 mg/dL (ref 65–99)
POTASSIUM: 4.2 mmol/L (ref 3.5–5.2)
SODIUM: 140 mmol/L (ref 134–144)
TOTAL PROTEIN: 7.3 g/dL (ref 6.0–8.5)

## 2016-10-12 NOTE — Patient Instructions (Addendum)
Medication Instructions:  Your physician recommends that you continue on your current medications as directed. Please refer to the Current Medication list given to you today. 1. Make sure you are taking your colchicine twice daily.  Labwork Your physician recommends that you have lab work today: cmet   Testing/Procedures: -None  Follow-Up: Your physician recommends that you keep your schedule a follow-up appointment with Dr. Mayford Knifeurner.    Any Other Special Instructions Will Be Listed Below (If Applicable).  Work on trying to quit smoking.    Low-Sodium Eating Plan Sodium, which is an element that makes up salt, helps you maintain a healthy balance of fluids in your body. Too much sodium can increase your blood pressure and cause fluid and waste to be held in your body. Your health care provider or dietitian may recommend following this plan if you have high blood pressure (hypertension), kidney disease, liver disease, or heart failure. Eating less sodium can help lower your blood pressure, reduce swelling, and protect your heart, liver, and kidneys. What are tips for following this plan? General guidelines   Most people on this plan should limit their sodium intake to 1,500-2,000 mg (milligrams) of sodium each day. Reading food labels   The Nutrition Facts label lists the amount of sodium in one serving of the food. If you eat more than one serving, you must multiply the listed amount of sodium by the number of servings.  Choose foods with less than 140 mg of sodium per serving.  Avoid foods with 300 mg of sodium or more per serving. Shopping   Look for lower-sodium products, often labeled as "low-sodium" or "no salt added."  Always check the sodium content even if foods are labeled as "unsalted" or "no salt added".  Buy fresh foods.  Avoid canned foods and premade or frozen meals.  Avoid canned, cured, or processed meats  Buy breads that have less than 80 mg of sodium per  slice. Cooking   Eat more home-cooked food and less restaurant, buffet, and fast food.  Avoid adding salt when cooking. Use salt-free seasonings or herbs instead of table salt or sea salt. Check with your health care provider or pharmacist before using salt substitutes.  Cook with plant-based oils, such as canola, sunflower, or olive oil. Meal planning   When eating at a restaurant, ask that your food be prepared with less salt or no salt, if possible.  Avoid foods that contain MSG (monosodium glutamate). MSG is sometimes added to Cong[place]inese food, bouillon, and some canned foods. What foods are recommended? The items listed may not be a complete list. Talk with your dietitian about what dietary choices are best for you. Grains  Low-sodium cereals, including oats, puffed wheat and rice, and shredded wheat. Low-sodium crackers. Unsalted rice. Unsalted pasta. Low-sodium bread. Whole-grain breads and whole-grain pasta. Vegetables  Fresh or frozen vegetables. "No salt added" canned vegetables. "No salt added" tomato sauce and paste. Low-sodium or reduced-sodium tomato and vegetable juice. Fruits  Fresh, frozen, or canned fruit. Fruit juice. Meats and other protein foods  Fresh or frozen (no salt added) meat, poultry, seafood, and fish. Low-sodium canned tuna and salmon. Unsalted nuts. Dried peas, beans, and lentils without added salt. Unsalted canned beans. Eggs. Unsalted nut butters. Dairy  Milk. Soy milk. Cheese that is naturally low in sodium, such as ricotta cheese, fresh mozzarella, or Swiss cheese Low-sodium or reduced-sodium cheese. Cream cheese. Yogurt. Fats and oils  Unsalted butter. Unsalted margarine with no trans fat. Vegetable oils  such as canola or olive oils. Seasonings and other foods  Fresh and dried herbs and spices. Salt-free seasonings. Low-sodium mustard and ketchup. Sodium-free salad dressing. Sodium-free light mayonnaise. Fresh or refrigerated horseradish. Lemon juice.  Vinegar. Homemade, reduced-sodium, or low-sodium soups. Unsalted popcorn and pretzels. Low-salt or salt-free chips. What foods are not recommended? The items listed may not be a complete list. Talk with your dietitian about what dietary choices are best for you. Grains  Instant hot cereals. Bread stuffing, pancake, and biscuit mixes. Croutons. Seasoned rice or pasta mixes. Noodle soup cups. Boxed or frozen macaroni and cheese. Regular salted crackers. Self-rising flour. Vegetables  Sauerkraut, pickled vegetables, and relishes. Olives. [place] fries. Onion rings. Regular canned vegetables (not low-sodium or reduced-sodium). Regular canned tomato sauce and paste (not low-sodium or reduced-sodium). Regular tomato and vegetable juice (not low-sodium or reduced-sodium). Frozen vegetables in sauces. Meats and other protein foods  Meat or fish that is salted, canned, smoked, spiced, or pickled. Bacon, ham, sausage, hotdogs, corned beef, chipped beef, packaged lunch meats, salt pork, jerky, pickled herring, anchovies, regular canned tuna, sardines, salted nuts. Dairy  Processed cheese and cheese spreads. Cheese curds. Blue cheese. Feta cheese. String cheese. Regular cottage cheese. Buttermilk. Canned milk. Fats and oils  Salted butter. Regular margarine. Ghee. Bacon fat. Seasonings and other foods  Onion salt, garlic salt, seasoned salt, table salt, and sea salt. Canned and packaged gravies. Worcestershire sauce. Tartar sauce. Barbecue sauce. Teriyaki sauce. Soy sauce, including reduced-sodium. Steak sauce. Fish sauce. Oyster sauce. Cocktail sauce. Horseradish that you find on the shelf. Regular ketchup and mustard. Meat flavorings and tenderizers. Bouillon cubes. Hot sauce and Tabasco sauce. Premade or packaged marinades. Premade or packaged taco seasonings. Relishes. Regular salad dressings. Salsa. Potato and tortilla chips. Corn chips and puffs. Salted popcorn and pretzels. Canned or dried soups. Pizza.  Frozen entrees and pot pies. Summary  Eating less sodium can help lower your blood pressure, reduce swelling, and protect your heart, liver, and kidneys.  Most people on this plan should limit their sodium intake to 1,500-2,000 mg (milligrams) of sodium each day.  Canned, boxed, and frozen foods are high in sodium. Restaurant foods, fast foods, and pizza are also very high in sodium. You also get sodium by adding salt to food.  Try to cook at home, eat more fresh fruits and vegetables, and eat less fast food, canned, processed, or prepared foods. This information is not intended to replace advice given to you by your health care provider. Make sure you discuss any questions you have with your health care provider. Document Released: 11/04/2001 Document Revised: 05/08/2016 Document Reviewed: 05/08/2016 Elsevier Interactive Patient Education  2017 [employer].      If you need a refill on your cardiac medications before your next appointment, please call your pharmacy.

## 2016-10-13 ENCOUNTER — Telehealth: Payer: Self-pay | Admitting: Physician Assistant

## 2016-10-13 NOTE — Telephone Encounter (Signed)
New message  ° ° ° ° °Pt is returning your call for lab results  °

## 2016-10-13 NOTE — Telephone Encounter (Signed)
Patient called back about his lab results. Per Jacolyn ReedyMichele Lenze PA, Potassium normal but liver function elevated. Has been on lipitor. Have primary MD f/u liver functions in 2-3 weeks. Patient stated his liver function has been elevated and his PCP is aware.

## 2016-12-20 ENCOUNTER — Ambulatory Visit: Payer: Managed Care, Other (non HMO) | Admitting: Cardiology

## 2017-01-26 ENCOUNTER — Ambulatory Visit: Payer: Managed Care, Other (non HMO) | Admitting: Cardiology

## 2019-08-25 ENCOUNTER — Other Ambulatory Visit: Payer: Self-pay

## 2019-08-25 ENCOUNTER — Encounter (HOSPITAL_BASED_OUTPATIENT_CLINIC_OR_DEPARTMENT_OTHER): Payer: Self-pay | Admitting: *Deleted

## 2019-08-25 ENCOUNTER — Emergency Department (HOSPITAL_BASED_OUTPATIENT_CLINIC_OR_DEPARTMENT_OTHER)
Admission: EM | Admit: 2019-08-25 | Discharge: 2019-08-26 | Disposition: A | Discharge status: Home / Self Care | Payer: BC Managed Care – PPO | Attending: Emergency Medicine | Admitting: Emergency Medicine

## 2019-08-25 ENCOUNTER — Emergency Department (HOSPITAL_BASED_OUTPATIENT_CLINIC_OR_DEPARTMENT_OTHER): Payer: BC Managed Care – PPO

## 2019-08-25 DIAGNOSIS — I10 Essential (primary) hypertension: Secondary | ICD-10-CM | POA: Insufficient documentation

## 2019-08-25 DIAGNOSIS — Z79899 Other long term (current) drug therapy: Secondary | ICD-10-CM | POA: Insufficient documentation

## 2019-08-25 DIAGNOSIS — R0789 Other chest pain: Secondary | ICD-10-CM | POA: Diagnosis present

## 2019-08-25 DIAGNOSIS — F17210 Nicotine dependence, cigarettes, uncomplicated: Secondary | ICD-10-CM | POA: Insufficient documentation

## 2019-08-25 DIAGNOSIS — I300 Acute nonspecific idiopathic pericarditis: Secondary | ICD-10-CM | POA: Diagnosis not present

## 2019-08-25 LAB — CBC WITH DIFFERENTIAL/PLATELET
Abs Immature Granulocytes: 0.05 10*3/uL (ref 0.00–0.07)
Basophils Absolute: 0.1 10*3/uL (ref 0.0–0.1)
Basophils Relative: 1 %
Eosinophils Absolute: 0.3 10*3/uL (ref 0.0–0.5)
Eosinophils Relative: 3 %
HCT: 42 % (ref 39.0–52.0)
Hemoglobin: 13.3 g/dL (ref 13.0–17.0)
Immature Granulocytes: 1 %
Lymphocytes Relative: 27 %
Lymphs Abs: 2.3 10*3/uL (ref 0.7–4.0)
MCH: 24.5 pg — ABNORMAL LOW (ref 26.0–34.0)
MCHC: 31.7 g/dL (ref 30.0–36.0)
MCV: 77.3 fL — ABNORMAL LOW (ref 80.0–100.0)
Monocytes Absolute: 0.9 10*3/uL (ref 0.1–1.0)
Monocytes Relative: 11 %
Neutro Abs: 4.8 10*3/uL (ref 1.7–7.7)
Neutrophils Relative %: 57 %
Platelets: 232 10*3/uL (ref 150–400)
RBC: 5.43 MIL/uL (ref 4.22–5.81)
RDW: 15 % (ref 11.5–15.5)
WBC: 8.4 10*3/uL (ref 4.0–10.5)
nRBC: 0 % (ref 0.0–0.2)

## 2019-08-25 MED ORDER — NITROGLYCERIN 0.4 MG SL SUBL
0.4000 mg | SUBLINGUAL_TABLET | SUBLINGUAL | Status: DC | PRN
  Administered 2019-08-25 (×2): 0.4 mg via SUBLINGUAL
  Filled 2019-08-25: qty 1

## 2019-08-25 MED ORDER — ASPIRIN 81 MG PO CHEW
324.0000 mg | CHEWABLE_TABLET | Freq: Once | ORAL | Status: AC
  Administered 2019-08-25: 324 mg via ORAL
  Filled 2019-08-25: qty 4

## 2019-08-25 MED ORDER — NITROGLYCERIN 0.4 MG SL SUBL
SUBLINGUAL_TABLET | SUBLINGUAL | Status: AC
  Filled 2019-08-25: qty 1

## 2019-08-25 MED ORDER — SODIUM CHLORIDE 0.9% FLUSH
3.0000 mL | Freq: Once | INTRAVENOUS | Status: DC
  Filled 2019-08-25: qty 3

## 2019-08-25 NOTE — ED Triage Notes (Signed)
Pt reports that he was in a bar and began to have a HA, chest tightness and SOB. Denies N/V.

## 2019-08-25 NOTE — ED Provider Notes (Signed)
[facility] Provider Note: Georgena Spurling, MD, FACEP  CSN: [id number] MRN: [id number] ARRIVAL: 08/25/19 at [place]: [place]  Chest Pain   HISTORY OF PRESENT ILLNESS  08/25/19 11:03 PM Mark Powell is a 49 y.o. male developed chest pain about 5 PM while at a bar.  The pain started gradually and has worsened.  He now rates it as a 7 out of 10.  He describes the pain as feeling like something or someone is sitting on his chest.  It is somewhat worse when lying supine and somewhat improved with sitting up and leaning forward.  He has a history of pericarditis and thinks it may be similar.  He describes the pain as being localized around his entire chest.  He is short of breath with this and the pain is worse with deep breathing.  He states it is difficult to take a deep breath.  He denies diaphoresis or nausea but does have a headache with this.  Cardiac catheterization in 2018 showed normal coronary arteries and normal left ventricular function.   Past Medical History:  Diagnosis Date  . High cholesterol   . Hypertension     Past Surgical History:  Procedure Laterality Date  . arm surgery    . LEFT HEART CATH AND CORONARY ANGIOGRAPHY N/A 09/26/2016   Procedure: Left Heart Cath and Coronary Angiography;  Surgeon: Troy Sine, MD;  Location: [facility];  Service: Cardiovascular;  Laterality: N/A;    Family History  Problem Relation Age of Onset  . Heart attack Brother 74    Social History   Tobacco Use  . Smoking status: Current Every Day Smoker  . Smokeless tobacco: Never Used  Substance Use Topics  . Alcohol use: Yes    Comment: daily  . Drug use: Yes    Types: Marijuana    Prior to Admission medications   Medication Sig Start Date End Date Taking? Authorizing Provider  losartan-hydrochlorothiazide (HYZAAR) 50-12.5 MG tablet Take 1 tablet by mouth daily.   Yes [provider]  atorvastatin (LIPITOR) 10 MG tablet  Take 10 mg by mouth daily.    [provider]  colchicine 0.6 MG tablet Take 1 tablet (0.6 mg total) by mouth daily. Take for 3 months. 08/26/19   Shanell Aden, Jenny Reichmann, MD  naproxen (NAPROSYN) 500 MG tablet Take 1 tablet (500 mg total) by mouth 2 (two) times daily with a meal. 08/26/19   Asahel Risden, MD  Vitamin D, Ergocalciferol, (DRISDOL) 50000 units CAPS capsule Take 50,000 Units by mouth every 7 (seven) days.    [provider]    Allergies Patient has no known allergies.   REVIEW OF SYSTEMS  Negative except as noted here or in the History of Present Illness.   PHYSICAL EXAMINATION  Initial Vital Signs Blood pressure (!) 160/112, pulse (!) 113, temperature 97.8 F (36.6 C), temperature source Oral, resp. rate 18, height 6\' 2"  (1.88 m), weight 79.4 kg, SpO2 98 %.  Examination General: Well-developed, well-nourished male in no acute distress; appearance consistent with age of record HENT: normocephalic; atraumatic Eyes: pupils equal, round and reactive to light; extraocular muscles intact Neck: supple Heart: regular rate and rhythm; no murmur or rub; tachycardia Lungs: clear to auscultation bilaterally Abdomen: soft; nondistended; nontender; bowel sounds present Extremities: No deformity; full range of motion Neurologic: Awake, alert and oriented; motor function intact in all extremities and symmetric; no facial droop Skin: Warm and dry Psychiatric: Anxious  RESULTS  Summary of this visit's results, reviewed and interpreted by myself:   EKG Interpretation  Date/Time:  Monday August 25 2019 22:21:17 EDT Ventricular Rate:  114 PR Interval:  144 QRS Duration: 86 QT Interval:  336 QTC Calculation: 463 R Axis:   57 Text Interpretation: Sinus tachycardia Otherwise normal ECG Rate is faster Confirmed by Gean Laursen ([id number]) on 08/25/2019 10:48:04 PM       EKG Interpretation  Date/Time:  Monday August 25 2019 23:11:27 EDT Ventricular Rate:  100 PR  Interval:  144 QRS Duration: 85 QT Interval:  350 QTC Calculation: 452 R Axis:   55 Text Interpretation: Sinus tachycardia ST elev, probable normal early repol pattern Confirmed by Paula Libra ([id number]) on 08/25/2019 11:14:48 PM      Laboratory Studies: Results for orders placed or performed during the hospital encounter of 08/25/19 (from the past 24 hour(s))  Basic metabolic panel     Status: Abnormal   Collection Time: 08/25/19 11:21 PM  Result Value Ref Range   Sodium 138 135 - 145 mmol/L   Potassium 3.6 3.5 - 5.1 mmol/L   Chloride 98 98 - 111 mmol/L   CO2 27 22 - 32 mmol/L   Glucose, Bld 158 (H) 70 - 99 mg/dL   BUN 11 6 - 20 mg/dL   Creatinine, Ser 8.29 0.61 - 1.24 mg/dL   Calcium 9.2 8.9 - 56.2 mg/dL   GFR calc non Af Amer >60 >60 mL/min   GFR calc Af Amer >60 >60 mL/min   Anion gap 13 5 - 15  CBC with Differential/Platelet     Status: Abnormal   Collection Time: 08/25/19 11:21 PM  Result Value Ref Range   WBC 8.4 4.0 - 10.5 K/uL   RBC 5.43 4.22 - 5.81 MIL/uL   Hemoglobin 13.3 13.0 - 17.0 g/dL   HCT 13.0 86.5 - 78.4 %   MCV 77.3 (L) 80.0 - 100.0 fL   MCH 24.5 (L) 26.0 - 34.0 pg   MCHC 31.7 30.0 - 36.0 g/dL   RDW 69.6 29.5 - 28.4 %   Platelets 232 150 - 400 K/uL   nRBC 0.0 0.0 - 0.2 %   Neutrophils Relative % 57 %   Neutro Abs 4.8 1.7 - 7.7 K/uL   Lymphocytes Relative 27 %   Lymphs Abs 2.3 0.7 - 4.0 K/uL   Monocytes Relative 11 %   Monocytes Absolute 0.9 0.1 - 1.0 K/uL   Eosinophils Relative 3 %   Eosinophils Absolute 0.3 0.0 - 0.5 K/uL   Basophils Relative 1 %   Basophils Absolute 0.1 0.0 - 0.1 K/uL   Immature Granulocytes 1 %   Abs Immature Granulocytes 0.05 0.00 - 0.07 K/uL  Troponin I (High Sensitivity)     Status: None   Collection Time: 08/25/19 11:21 PM  Result Value Ref Range   Troponin I (High Sensitivity) 4 <18 ng/L   Imaging Studies: DG Chest 2 View  Result Date: 08/25/2019 CLINICAL DATA:  Headache, chest tightness, short of breath, tobacco abuse  EXAM: CHEST - 2 VIEW COMPARISON:  09/25/2016 FINDINGS: The heart size and mediastinal contours are within normal limits. Both lungs are clear. The visualized skeletal structures are unremarkable. IMPRESSION: No active cardiopulmonary disease. Electronically Signed   By: Sharlet Salina M.D.   On: 08/25/2019 22:42    ED COURSE and MDM  Nursing notes, initial and subsequent vitals signs, including pulse oximetry, reviewed and interpreted by myself.  Vitals:   08/26/19 0100 08/26/19 0115 08/26/19  0130 08/26/19 0145  BP: 129/83 130/84 130/82 127/78  Pulse: 94 (!) 103 100 99  Resp: 18 19 18 19   Temp:      TempSrc:      SpO2: 94% 96% 96% 96%  Weight:      Height:       Medications  sodium chloride flush (NS) 0.9 % injection 3 mL (has no administration in time range)  nitroGLYCERIN (NITROSTAT) SL tablet 0.4 mg (0.4 mg Sublingual Given 08/25/19 2354)  aspirin chewable tablet 324 mg (324 mg Oral Given 08/25/19 2325)  ketorolac (TORADOL) 15 MG/ML injection 15 mg (15 mg Intravenous Given 08/26/19 0004)   1:54 AM Patient sleeping.  Equivocal pain improvement after sublingual nitroglycerin.  Pain down to a 1 or 2 out of 10 after IV Toradol.  1:48 AM Patient's pain is minimal and he has 2 nonischemic EKGs.  His troponin is normal (4) greater than 6 hours after onset of pain.  He had a heart catheterization 3 years ago which showed normal coronaries.  He is at low risk for cardiac ischemia and his etiology is most likely pericarditis.  He has had pericarditis before with similar symptoms.  We will treat for pericarditis.   PROCEDURES  Procedures   ED DIAGNOSES     ICD-10-CM   1. Acute idiopathic pericarditis  I30.0        Artie Mcintyre, MD 08/26/19 [phone]

## 2019-08-26 LAB — BASIC METABOLIC PANEL
Anion gap: 13 (ref 5–15)
BUN: 11 mg/dL (ref 6–20)
CO2: 27 mmol/L (ref 22–32)
Calcium: 9.2 mg/dL (ref 8.9–10.3)
Chloride: 98 mmol/L (ref 98–111)
Creatinine, Ser: 0.83 mg/dL (ref 0.61–1.24)
GFR calc Af Amer: >60 mL/min (ref >60)
GFR calc non Af Amer: >60 mL/min (ref >60)
Glucose, Bld: 158 mg/dL — ABNORMAL HIGH (ref 70–99)
Potassium: 3.6 mmol/L (ref 3.5–5.1)
Sodium: 138 mmol/L (ref 135–145)

## 2019-08-26 LAB — TROPONIN I (HIGH SENSITIVITY): Troponin I (High Sensitivity): 4 ng/L (ref <18)

## 2019-08-26 MED ORDER — NAPROXEN 500 MG PO TABS: 500.0000 mg | ORAL_TABLET | Freq: Two times a day (BID) | ORAL | 0 refills | Status: DC

## 2019-08-26 MED ORDER — KETOROLAC TROMETHAMINE 15 MG/ML IJ SOLN
15.0000 mg | Freq: Once | INTRAMUSCULAR | Status: AC
  Administered 2019-08-26: 15 mg via INTRAVENOUS
  Filled 2019-08-26: qty 1

## 2019-08-26 MED ORDER — COLCHICINE 0.6 MG PO TABS: 0.6000 mg | ORAL_TABLET | Freq: Every day | ORAL | 3 refills | Status: DC

## 2020-09-04 ENCOUNTER — Emergency Department (HOSPITAL_BASED_OUTPATIENT_CLINIC_OR_DEPARTMENT_OTHER): Payer: BC Managed Care – PPO

## 2020-09-04 ENCOUNTER — Other Ambulatory Visit: Payer: Self-pay

## 2020-09-04 ENCOUNTER — Emergency Department (HOSPITAL_BASED_OUTPATIENT_CLINIC_OR_DEPARTMENT_OTHER)
Admission: EM | Admit: 2020-09-04 | Discharge: 2020-09-04 | Disposition: A | Discharge status: Home / Self Care | Payer: BC Managed Care – PPO | Attending: Emergency Medicine | Admitting: Emergency Medicine

## 2020-09-04 ENCOUNTER — Encounter (HOSPITAL_BASED_OUTPATIENT_CLINIC_OR_DEPARTMENT_OTHER): Payer: Self-pay | Admitting: Emergency Medicine

## 2020-09-04 DIAGNOSIS — F17200 Nicotine dependence, unspecified, uncomplicated: Secondary | ICD-10-CM | POA: Insufficient documentation

## 2020-09-04 DIAGNOSIS — R079 Chest pain, unspecified: Secondary | ICD-10-CM

## 2020-09-04 DIAGNOSIS — R0602 Shortness of breath: Secondary | ICD-10-CM | POA: Diagnosis not present

## 2020-09-04 DIAGNOSIS — R072 Precordial pain: Secondary | ICD-10-CM | POA: Insufficient documentation

## 2020-09-04 DIAGNOSIS — I10 Essential (primary) hypertension: Secondary | ICD-10-CM | POA: Insufficient documentation

## 2020-09-04 DIAGNOSIS — R61 Generalized hyperhidrosis: Secondary | ICD-10-CM | POA: Diagnosis not present

## 2020-09-04 DIAGNOSIS — Z79899 Other long term (current) drug therapy: Secondary | ICD-10-CM | POA: Insufficient documentation

## 2020-09-04 LAB — BASIC METABOLIC PANEL
Anion gap: 11 (ref 5–15)
BUN: 12 mg/dL (ref 6–20)
CO2: 25 mmol/L (ref 22–32)
Calcium: 8.7 mg/dL — ABNORMAL LOW (ref 8.9–10.3)
Chloride: 100 mmol/L (ref 98–111)
Creatinine, Ser: 1.06 mg/dL (ref 0.61–1.24)
GFR, Estimated: >60 mL/min (ref >60)
Glucose, Bld: 132 mg/dL — ABNORMAL HIGH (ref 70–99)
Potassium: 3.6 mmol/L (ref 3.5–5.1)
Sodium: 136 mmol/L (ref 135–145)

## 2020-09-04 LAB — CBC
HCT: 42.3 % (ref 39.0–52.0)
Hemoglobin: 13.6 g/dL (ref 13.0–17.0)
MCH: 24 pg — ABNORMAL LOW (ref 26.0–34.0)
MCHC: 32.2 g/dL (ref 30.0–36.0)
MCV: 74.7 fL — ABNORMAL LOW (ref 80.0–100.0)
Platelets: 229 10*3/uL (ref 150–400)
RBC: 5.66 MIL/uL (ref 4.22–5.81)
RDW: 15.9 % — ABNORMAL HIGH (ref 11.5–15.5)
WBC: 8.4 10*3/uL (ref 4.0–10.5)
nRBC: 0 % (ref 0.0–0.2)

## 2020-09-04 LAB — CBG MONITORING, ED: Glucose-Capillary: 111 mg/dL — ABNORMAL HIGH (ref 70–99)

## 2020-09-04 LAB — TROPONIN I (HIGH SENSITIVITY)
Troponin I (High Sensitivity): 3 ng/L (ref <18)
Troponin I (High Sensitivity): 3 ng/L (ref <18)

## 2020-09-04 MED ORDER — MORPHINE SULFATE (PF) 2 MG/ML IV SOLN
2.0000 mg | Freq: Once | INTRAVENOUS | Status: AC
  Administered 2020-09-04: 2 mg via INTRAVENOUS
  Filled 2020-09-04: qty 1

## 2020-09-04 MED ORDER — KETOROLAC TROMETHAMINE 15 MG/ML IJ SOLN
15.0000 mg | Freq: Once | INTRAMUSCULAR | Status: AC
  Administered 2020-09-04: 15 mg via INTRAVENOUS
  Filled 2020-09-04: qty 1

## 2020-09-04 MED ORDER — SODIUM CHLORIDE 0.9 % IV BOLUS
1000.0000 mL | Freq: Once | INTRAVENOUS | Status: AC
  Administered 2020-09-04: 1000 mL via INTRAVENOUS

## 2020-09-04 MED ORDER — ALUM & MAG HYDROXIDE-SIMETH 200-200-20 MG/5ML PO SUSP
30.0000 mL | Freq: Once | ORAL | Status: AC
  Administered 2020-09-04: 30 mL via ORAL
  Filled 2020-09-04: qty 30

## 2020-09-04 MED ORDER — ASPIRIN 81 MG PO CHEW
324.0000 mg | CHEWABLE_TABLET | Freq: Once | ORAL | Status: AC
  Administered 2020-09-04: 324 mg via ORAL
  Filled 2020-09-04: qty 4

## 2020-09-04 NOTE — ED Triage Notes (Signed)
Pt arrives pov with driver, c/o diffused CP with shob and nausea that woke up patient this am. Hx of same

## 2020-09-04 NOTE — ED Provider Notes (Signed)
[facility] EMERGENCY DEPARTMENT Provider Note   CSN: [id number] Arrival date & time: 09/04/20  0804     History Chief Complaint  Patient presents with  . Chest Pain    Mark Powell is a 50 y.o. male.  50 yo M with a chief complaint of chest pain.  Woke up with it this morning progressively worsening.  Made him diaphoretic and short of breath.  Has a history of this, tells me it occurs about every year.  Thought to be pericarditis.  Had an episode that brought him into the hospital and ended up having a cardiac catheterization performed that was completely clean.  Was seen last year with similar symptoms and improved with NSAIDs.  The history is provided by the patient.  Chest Pain Pain location:  Substernal area Pain quality: pressure   Pain radiates to:  Does not radiate Pain severity:  Severe Onset quality:  Gradual Duration:  2 hours Timing:  Constant Progression:  Worsening Chronicity:  Recurrent Relieved by:  Nothing Worsened by:  Nothing Ineffective treatments:  None tried Associated symptoms: diaphoresis and shortness of breath   Associated symptoms: no abdominal pain, no fever, no headache, no palpitations and no vomiting        Past Medical History:  Diagnosis Date  . High cholesterol   . Hypertension     Patient Active Problem List   Diagnosis Date Noted  . Hypokalemia 10/12/2016  . Acute pericarditis 09/27/2016  . Chest pain 09/26/2016  . HLD (hyperlipidemia) 09/26/2016  . HTN (hypertension) 09/26/2016  . Chest pain with high risk for cardiac etiology     Past Surgical History:  Procedure Laterality Date  . arm surgery    . LEFT HEART CATH AND CORONARY ANGIOGRAPHY N/A 09/26/2016   Procedure: Left Heart Cath and Coronary Angiography;  Surgeon: Lennette Bihari, MD;  Location: [facility] INVASIVE CV LAB;  Service: Cardiovascular;  Laterality: N/A;       Family History  Problem Relation Age of Onset  . Heart attack Brother 47    Social  History   Tobacco Use  . Smoking status: Current Every Day Smoker  . Smokeless tobacco: Never Used  Substance Use Topics  . Alcohol use: Yes    Comment: daily  . Drug use: Not Currently    Types: Marijuana    Home Medications Prior to Admission medications   Medication Sig Start Date End Date Taking? Authorizing Provider  atorvastatin (LIPITOR) 10 MG tablet Take 10 mg by mouth daily.    [provider]  colchicine 0.6 MG tablet Take 1 tablet (0.6 mg total) by mouth daily. Take for 3 months. 08/26/19   Molpus, John, MD  losartan-hydrochlorothiazide (HYZAAR) 50-12.5 MG tablet Take 1 tablet by mouth daily.    [provider]  naproxen (NAPROSYN) 500 MG tablet Take 1 tablet (500 mg total) by mouth 2 (two) times daily with a meal. 08/26/19   Molpus, John, MD  Vitamin D, Ergocalciferol, (DRISDOL) 50000 units CAPS capsule Take 50,000 Units by mouth every 7 (seven) days.    [provider]    Allergies    Patient has no known allergies.  Review of Systems   Review of Systems  Constitutional: Positive for diaphoresis. Negative for chills and fever.  HENT: Negative for congestion and facial swelling.   Eyes: Negative for discharge and visual disturbance.  Respiratory: Positive for shortness of breath.   Cardiovascular: Positive for chest pain. Negative for palpitations.  Gastrointestinal: Negative for abdominal  pain, diarrhea and vomiting.  Musculoskeletal: Negative for arthralgias and myalgias.  Skin: Negative for color change and rash.  Neurological: Negative for tremors, syncope and headaches.  Psychiatric/Behavioral: Negative for confusion and dysphoric mood.    Physical Exam Updated Vital Signs BP 102/74   Pulse 92   Temp 97.7 F (36.5 C) (Oral)   Resp (!) 21   Ht 6\' 2"  (1.88 m)   Wt 86.2 kg   SpO2 98%   BMI 24.39 kg/m   Physical Exam Vitals and nursing note reviewed.  Constitutional:      Appearance: He is well-developed.  HENT:     Head:  Normocephalic and atraumatic.  Eyes:     Pupils: Pupils are equal, round, and reactive to light.  Neck:     Vascular: No JVD.  Cardiovascular:     Rate and Rhythm: Normal rate and regular rhythm.     Heart sounds: No murmur heard. No friction rub. No gallop.   Pulmonary:     Effort: No respiratory distress.     Breath sounds: No wheezing.  Abdominal:     General: There is no distension.     Tenderness: There is no guarding or rebound.  Musculoskeletal:        General: Normal range of motion.     Cervical back: Normal range of motion and neck supple.  Skin:    Coloration: Skin is not pale.     Findings: No rash.  Neurological:     Mental Status: He is alert and oriented to person, place, and time.  Psychiatric:        Behavior: Behavior normal.     ED Results / Procedures / Treatments   Labs (all labs ordered are listed, but only abnormal results are displayed) Labs Reviewed  BASIC METABOLIC PANEL - Abnormal; Notable for the following components:      Result Value   Glucose, Bld 132 (*)    Calcium 8.7 (*)    All other components within normal limits  CBC - Abnormal; Notable for the following components:   MCV 74.7 (*)    MCH 24.0 (*)    RDW 15.9 (*)    All other components within normal limits  CBG MONITORING, ED - Abnormal; Notable for the following components:   Glucose-Capillary 111 (*)    All other components within normal limits  TROPONIN I (HIGH SENSITIVITY)  TROPONIN I (HIGH SENSITIVITY)    EKG EKG Interpretation  Date/Time:  Saturday September 04 2020 08:15:01 EDT Ventricular Rate:  86 PR Interval:  148 QRS Duration: 84 QT Interval:  397 QTC Calculation: 475 R Axis:   47 Text Interpretation: Sinus rhythm No significant change since last tracing Confirmed by 08-01-1997 [phone]) on 09/04/2020 8:30:18 AM   Radiology DG Chest 2 View  Result Date: 09/04/2020 CLINICAL DATA:  50 year old male with history of chest pain. EXAM: CHEST - 2 VIEW COMPARISON:  Chest  x-ray 08/25/2019. FINDINGS: Lung volumes are normal. No consolidative airspace disease. No pleural effusions. No pneumothorax. No pulmonary nodule or mass noted. Pulmonary vasculature and the cardiomediastinal silhouette are within normal limits. IMPRESSION: No radiographic evidence of acute cardiopulmonary disease. Electronically Signed   By: 08/27/2019 M.D.   On: 09/04/2020 09:06    Procedures Procedures   Medications Ordered in ED Medications  ketorolac (TORADOL) 15 MG/ML injection 15 mg (has no administration in time range)  aspirin chewable tablet 324 mg (324 mg Oral Given 09/04/20 0849)  morphine 2 MG/ML injection  2 mg (2 mg Intravenous Given 09/04/20 0849)  alum & mag hydroxide-simeth (MAALOX/MYLANTA) 200-200-20 MG/5ML suspension 30 mL (30 mLs Oral Given 09/04/20 0849)  sodium chloride 0.9 % bolus 1,000 mL (0 mLs Intravenous Stopped 09/04/20 1039)  morphine 2 MG/ML injection 2 mg (2 mg Intravenous Given 09/04/20 1053)    ED Course  I have reviewed the triage vital signs and the nursing notes.  Pertinent labs & imaging results that were available during my care of the patient were reviewed by me and considered in my medical decision making (see chart for details).    MDM Rules/Calculators/A&P                          50 yo M with a chief complaint of chest pain.  I personally saw the patient in 2018 when he came in with a similar complaint.  He ended up having a cardiac catheterization done that was completely clean.  Has had recurrence almost every year since.  He is wondering if it is related to allergens as it seems to occur at the same time every year.  We will obtain 2 troponins.  EKG.  Attempt to treat pain.  Reassess.  Delta negative, no significant lyte abnormality, no significant anemia.  CXR viewed by me without focal infiltrate or ptx.   11:20 AM:  I have discussed the diagnosis/risks/treatment options with the patient and family and believe the pt to be eligible for  discharge home to follow-up with Cards. We also discussed returning to the ED immediately if new or worsening sx occur. We discussed the sx which are most concerning (e.g., sudden worsening pain, fever, inability to tolerate by mouth) that necessitate immediate return. Medications administered to the patient during their visit and any new prescriptions provided to the patient are listed below.  Medications given during this visit Medications  ketorolac (TORADOL) 15 MG/ML injection 15 mg (has no administration in time range)  aspirin chewable tablet 324 mg (324 mg Oral Given 09/04/20 0849)  morphine 2 MG/ML injection 2 mg (2 mg Intravenous Given 09/04/20 0849)  alum & mag hydroxide-simeth (MAALOX/MYLANTA) 200-200-20 MG/5ML suspension 30 mL (30 mLs Oral Given 09/04/20 0849)  sodium chloride 0.9 % bolus 1,000 mL (0 mLs Intravenous Stopped 09/04/20 1039)  morphine 2 MG/ML injection 2 mg (2 mg Intravenous Given 09/04/20 1053)     The patient appears reasonably screen and/or stabilized for discharge and I doubt any other medical condition or other [facility] requiring further screening, evaluation, or treatment in the ED at this time prior to discharge.   Final Clinical Impression(s) / ED Diagnoses Final diagnoses:  Nonspecific chest pain    Rx / DC Orders ED Discharge Orders    None       Melene Plan, DO 09/04/20 1120

## 2020-09-04 NOTE — ED Notes (Signed)
Patient transported to X-ray 

## 2020-09-04 NOTE — Discharge Instructions (Signed)
Take 4 over the counter ibuprofen tablets 3 times a day or 2 over-the-counter naproxen tablets twice a day for pain. Also take tylenol 1000mg(2 extra strength) four times a day.    

## 2020-09-04 NOTE — ED Notes (Signed)
Patient transported toxrT

## 2020-09-21 LAB — HM COLONOSCOPY

## 2021-03-26 ENCOUNTER — Encounter (HOSPITAL_BASED_OUTPATIENT_CLINIC_OR_DEPARTMENT_OTHER): Payer: Self-pay

## 2021-03-26 ENCOUNTER — Emergency Department (HOSPITAL_BASED_OUTPATIENT_CLINIC_OR_DEPARTMENT_OTHER): Payer: 59

## 2021-03-26 ENCOUNTER — Emergency Department (HOSPITAL_BASED_OUTPATIENT_CLINIC_OR_DEPARTMENT_OTHER)
Admission: EM | Admit: 2021-03-26 | Discharge: 2021-03-26 | Disposition: A | Discharge status: Home / Self Care | Payer: 59 | Attending: Emergency Medicine | Admitting: Emergency Medicine

## 2021-03-26 DIAGNOSIS — Z79899 Other long term (current) drug therapy: Secondary | ICD-10-CM | POA: Diagnosis not present

## 2021-03-26 DIAGNOSIS — F17200 Nicotine dependence, unspecified, uncomplicated: Secondary | ICD-10-CM | POA: Diagnosis not present

## 2021-03-26 DIAGNOSIS — Z955 Presence of coronary angioplasty implant and graft: Secondary | ICD-10-CM | POA: Insufficient documentation

## 2021-03-26 DIAGNOSIS — Z20822 Contact with and (suspected) exposure to covid-19: Secondary | ICD-10-CM | POA: Diagnosis not present

## 2021-03-26 DIAGNOSIS — R059 Cough, unspecified: Secondary | ICD-10-CM | POA: Diagnosis present

## 2021-03-26 DIAGNOSIS — I10 Essential (primary) hypertension: Secondary | ICD-10-CM | POA: Diagnosis not present

## 2021-03-26 DIAGNOSIS — J111 Influenza due to unidentified influenza virus with other respiratory manifestations: Secondary | ICD-10-CM

## 2021-03-26 HISTORY — DX: Disease of pericardium, unspecified: I31.9

## 2021-03-26 LAB — CBC
HCT: 43.9 % (ref 39.0–52.0)
Hemoglobin: 14.1 g/dL (ref 13.0–17.0)
MCH: 24.3 pg — ABNORMAL LOW (ref 26.0–34.0)
MCHC: 32.1 g/dL (ref 30.0–36.0)
MCV: 75.6 fL — ABNORMAL LOW (ref 80.0–100.0)
Platelets: 329 10*3/uL (ref 150–400)
RBC: 5.81 MIL/uL (ref 4.22–5.81)
RDW: 13.7 % (ref 11.5–15.5)
WBC: 12.8 10*3/uL — ABNORMAL HIGH (ref 4.0–10.5)
nRBC: 0 % (ref 0.0–0.2)

## 2021-03-26 LAB — TROPONIN I (HIGH SENSITIVITY): Troponin I (High Sensitivity): 3 ng/L (ref <18)

## 2021-03-26 LAB — BASIC METABOLIC PANEL
Anion gap: 9 (ref 5–15)
BUN: 17 mg/dL (ref 6–20)
CO2: 29 mmol/L (ref 22–32)
Calcium: 9.4 mg/dL (ref 8.9–10.3)
Chloride: 95 mmol/L — ABNORMAL LOW (ref 98–111)
Creatinine, Ser: 0.76 mg/dL (ref 0.61–1.24)
GFR, Estimated: >60 mL/min (ref >60)
Glucose, Bld: 89 mg/dL (ref 70–99)
Potassium: 3.4 mmol/L — ABNORMAL LOW (ref 3.5–5.1)
Sodium: 133 mmol/L — ABNORMAL LOW (ref 135–145)

## 2021-03-26 LAB — RESP PANEL BY RT-PCR (FLU A&B, COVID) ARPGX2
Influenza A by PCR: NEGATIVE
Influenza B by PCR: NEGATIVE
SARS Coronavirus 2 by RT PCR: NEGATIVE

## 2021-03-26 MED ORDER — KETOROLAC TROMETHAMINE 15 MG/ML IJ SOLN
15.0000 mg | Freq: Once | INTRAMUSCULAR | Status: AC
  Administered 2021-03-26: 15 mg via INTRAVENOUS
  Filled 2021-03-26: qty 1

## 2021-03-26 NOTE — Discharge Instructions (Signed)
Follow-up with a cardiologist as needed for the chest pain.  Your work-up was reassuring but still potentially could be a pericarditis.  Doubt this severe cause of the headache but may need to follow with your PCP if symptoms do not improve

## 2021-03-26 NOTE — ED Provider Notes (Signed)
[facility] EMERGENCY DEPARTMENT Provider Note   CSN: [id number] Arrival date & time: 03/26/21  1654     History Chief Complaint  Patient presents with   Shortness of Breath   Generalized Body Aches   Cough    Mark Powell is a 50 y.o. male.   Shortness of Breath Associated symptoms: chest pain and headaches   Associated symptoms: no abdominal pain   Cough Associated symptoms: chest pain, headaches, myalgias and shortness of breath   Patient feels bad.  Has felt bad for the last 4 days but states he been having some symptoms for a week or 2.  States he has some dull chest tightness.  Has some chest pain with deep breathing.  Aching in his shoulders and has a headache.  States he initially felt he could have a recurrence of his pericarditis.  States he took ibuprofen and colchicine without relief.  States he was around family Amber that did have some URI symptoms.  States he feels that there is some tightness in his throat.  No swelling his legs.  No frank coughing.  No swelling in his legs.  No nausea vomiting or diarrhea.    Past Medical History:  Diagnosis Date   High cholesterol    Hypertension    Pericarditis     Patient Active Problem List   Diagnosis Date Noted   Hypokalemia 10/12/2016   Acute pericarditis 09/27/2016   Chest pain 09/26/2016   HLD (hyperlipidemia) 09/26/2016   HTN (hypertension) 09/26/2016   Chest pain with high risk for cardiac etiology     Past Surgical History:  Procedure Laterality Date   arm surgery     LEFT HEART CATH AND CORONARY ANGIOGRAPHY N/A 09/26/2016   Procedure: Left Heart Cath and Coronary Angiography;  Surgeon: Lennette Bihari, MD;  Location: [facility] INVASIVE CV LAB;  Service: Cardiovascular;  Laterality: N/A;       Family History  Problem Relation Age of Onset   Heart attack Brother 76    Social History   Tobacco Use   Smoking status: Every Day   Smokeless tobacco: Never  Substance Use Topics   Alcohol use:  Yes    Comment: daily   Drug use: Not Currently    Types: Marijuana    Home Medications Prior to Admission medications   Medication Sig Start Date End Date Taking? Authorizing Provider  colchicine 0.6 MG tablet Take 1 tablet (0.6 mg total) by mouth daily. Take for 3 months. 08/26/19  Yes Molpus, John, MD  losartan-hydrochlorothiazide (HYZAAR) 50-12.5 MG tablet Take 1 tablet by mouth daily.   Yes [provider]  atorvastatin (LIPITOR) 10 MG tablet Take 10 mg by mouth daily.    [provider]  naproxen (NAPROSYN) 500 MG tablet Take 1 tablet (500 mg total) by mouth 2 (two) times daily with a meal. 08/26/19   Molpus, John, MD  Vitamin D, Ergocalciferol, (DRISDOL) 50000 units CAPS capsule Take 50,000 Units by mouth every 7 (seven) days.    [provider]    Allergies    Patient has no known allergies.  Review of Systems   Review of Systems  Constitutional:  Negative for activity change.  HENT:  Negative for congestion.   Respiratory:  Positive for shortness of breath.   Cardiovascular:  Positive for chest pain. Negative for leg swelling.  Gastrointestinal:  Negative for abdominal pain.  Genitourinary:  Negative for flank pain.  Musculoskeletal:  Positive for myalgias.  Neurological:  Positive  for headaches.  Psychiatric/Behavioral:  Negative for confusion.    Physical Exam Updated Vital Signs BP 128/86 (BP Location: Right Arm)   Pulse 85   Temp 97.9 F (36.6 C) (Oral)   Resp 15   Ht 6\' 2"  (1.88 m)   Wt 81.6 kg   SpO2 100%   BMI 23.11 kg/m   Physical Exam Vitals reviewed.  HENT:     Head: Atraumatic.  Cardiovascular:     Rate and Rhythm: Normal rate and regular rhythm.  Pulmonary:     Breath sounds: Normal breath sounds. No wheezing, rhonchi or rales.  Chest:     Chest wall: No tenderness.  Abdominal:     Tenderness: There is no abdominal tenderness.  Musculoskeletal:     Cervical back: Neck supple.     Right lower leg: No edema.      Left lower leg: No edema.  Skin:    General: Skin is warm.     Capillary Refill: Capillary refill takes less than 2 seconds.  Neurological:     Mental Status: He is alert and oriented to person, place, and time.    ED Results / Procedures / Treatments   Labs (all labs ordered are listed, but only abnormal results are displayed) Labs Reviewed  BASIC METABOLIC PANEL - Abnormal; Notable for the following components:      Result Value   Sodium 133 (*)    Potassium 3.4 (*)    Chloride 95 (*)    All other components within normal limits  CBC - Abnormal; Notable for the following components:   WBC 12.8 (*)    MCV 75.6 (*)    MCH 24.3 (*)    All other components within normal limits  RESP PANEL BY RT-PCR (FLU A&B, COVID) [id number]  TROPONIN I (HIGH SENSITIVITY)    EKG EKG Interpretation  Date/Time:  Saturday March 26 2021 17:07:55 EDT Ventricular Rate:  96 PR Interval:  142 QRS Duration: 76 QT Interval:  346 QTC Calculation: 437 R Axis:   45 Text Interpretation: Normal sinus rhythm Normal ECG Confirmed by 02[birth year]1981 [phone]) on 03/26/2021 5:12:24 PM  Radiology DG Chest 2 View  Result Date: 03/26/2021 CLINICAL DATA:  Shortness of breath and body aches. EXAM: CHEST - 2 VIEW COMPARISON:  September 04, 2020 FINDINGS: Mild, stable linear scarring and/or atelectasis is noted within the left lung base. There is no evidence of acute infiltrate, pleural effusion or pneumothorax. The heart size and mediastinal contours are within normal limits. The visualized skeletal structures are unremarkable. IMPRESSION: No active cardiopulmonary disease. Electronically Signed   By: September 06, 2020 M.D.   On: 03/26/2021 17:29    Procedures Procedures   Medications Ordered in ED Medications  ketorolac (TORADOL) 15 MG/ML injection 15 mg (15 mg Intravenous Given 03/26/21 2003)    ED Course  I have reviewed the triage vital signs and the nursing notes.  Pertinent labs & imaging results that  were available during my care of the patient were reviewed by me and considered in my medical decision making (see chart for details).    MDM Rules/Calculators/A&P                           Patient with URI type symptoms.  Has had for the last week or 2.  Also has headache.  Cough.  Dull chest pain.  Has had previous pericarditis.  Tried some treatment for that which did not really help.  Work-up reassuring.  Chest x-ray reassuring.  EKG reassuring blood work reassuring.  Dull headache and myalgias.  Negative COVID and flu testing.  I feel likely do not need intracranial imaging at this time.  May be just secondary headache from viral type syndromes.  Doubt meningitis.  Discharge home with outpatient follow-up as needed.  Follow-up with cardiology to for the chest pain although pericarditis is felt less likely. Final Clinical Impression(s) / ED Diagnoses Final diagnoses:  Influenza-like illness    Rx / DC Orders ED Discharge Orders     None        Benjiman Core, MD 03/27/21 0002

## 2021-03-26 NOTE — ED Triage Notes (Signed)
Pt endorses SOB, generalized body aches, headaches for 4 days. Pt states he becomes more dyspneic with exertion. Denies fever, cough. Pt states he has chest pain with deep inhalation.

## 2021-03-26 NOTE — ED Notes (Signed)
Pt transported to xray 

## 2021-04-07 ENCOUNTER — Emergency Department (HOSPITAL_BASED_OUTPATIENT_CLINIC_OR_DEPARTMENT_OTHER): Payer: 59

## 2021-04-07 ENCOUNTER — Observation Stay (HOSPITAL_BASED_OUTPATIENT_CLINIC_OR_DEPARTMENT_OTHER)
Admission: EM | Admit: 2021-04-07 | Discharge: 2021-04-08 | Disposition: A | Discharge status: Home / Self Care | Payer: 59 | Attending: Emergency Medicine | Admitting: Emergency Medicine

## 2021-04-07 ENCOUNTER — Other Ambulatory Visit: Payer: Self-pay

## 2021-04-07 ENCOUNTER — Encounter (HOSPITAL_COMMUNITY): Payer: Self-pay | Admitting: Cardiology

## 2021-04-07 DIAGNOSIS — R079 Chest pain, unspecified: Secondary | ICD-10-CM | POA: Diagnosis present

## 2021-04-07 DIAGNOSIS — Z20822 Contact with and (suspected) exposure to covid-19: Secondary | ICD-10-CM | POA: Diagnosis not present

## 2021-04-07 DIAGNOSIS — I10 Essential (primary) hypertension: Secondary | ICD-10-CM | POA: Insufficient documentation

## 2021-04-07 DIAGNOSIS — I309 Acute pericarditis, unspecified: Secondary | ICD-10-CM | POA: Diagnosis not present

## 2021-04-07 DIAGNOSIS — J90 Pleural effusion, not elsewhere classified: Secondary | ICD-10-CM

## 2021-04-07 DIAGNOSIS — F17200 Nicotine dependence, unspecified, uncomplicated: Secondary | ICD-10-CM | POA: Insufficient documentation

## 2021-04-07 DIAGNOSIS — I319 Disease of pericardium, unspecified: Secondary | ICD-10-CM | POA: Diagnosis present

## 2021-04-07 DIAGNOSIS — Z79899 Other long term (current) drug therapy: Secondary | ICD-10-CM | POA: Insufficient documentation

## 2021-04-07 LAB — CBC
HCT: 41.4 % (ref 39.0–52.0)
HCT: 39.8 % (ref 39.0–52.0)
Hemoglobin: 13.2 g/dL (ref 13.0–17.0)
Hemoglobin: 12.8 g/dL — ABNORMAL LOW (ref 13.0–17.0)
MCH: 23.6 pg — ABNORMAL LOW (ref 26.0–34.0)
MCH: 23.7 pg — ABNORMAL LOW (ref 26.0–34.0)
MCHC: 31.9 g/dL (ref 30.0–36.0)
MCHC: 32.2 g/dL (ref 30.0–36.0)
MCV: 73.9 fL — ABNORMAL LOW (ref 80.0–100.0)
MCV: 73.8 fL — ABNORMAL LOW (ref 80.0–100.0)
Platelets: 430 10*3/uL — ABNORMAL HIGH (ref 150–400)
Platelets: 413 10*3/uL — ABNORMAL HIGH (ref 150–400)
RBC: 5.6 MIL/uL (ref 4.22–5.81)
RBC: 5.39 MIL/uL (ref 4.22–5.81)
RDW: 13.9 % (ref 11.5–15.5)
RDW: 13.4 % (ref 11.5–15.5)
WBC: 16.2 10*3/uL — ABNORMAL HIGH (ref 4.0–10.5)
WBC: 13.6 10*3/uL — ABNORMAL HIGH (ref 4.0–10.5)
nRBC: 0 % (ref 0.0–0.2)
nRBC: 0 % (ref 0.0–0.2)

## 2021-04-07 LAB — TROPONIN I (HIGH SENSITIVITY)
Troponin I (High Sensitivity): 3 ng/L (ref <18)
Troponin I (High Sensitivity): 3 ng/L (ref <18)
Troponin I (High Sensitivity): 3 ng/L (ref <18)
Troponin I (High Sensitivity): 3 ng/L (ref <18)

## 2021-04-07 LAB — BASIC METABOLIC PANEL
Anion gap: 13 (ref 5–15)
BUN: 17 mg/dL (ref 6–20)
CO2: 24 mmol/L (ref 22–32)
Calcium: 8.8 mg/dL — ABNORMAL LOW (ref 8.9–10.3)
Chloride: 98 mmol/L (ref 98–111)
Creatinine, Ser: 0.75 mg/dL (ref 0.61–1.24)
GFR, Estimated: >60 mL/min (ref >60)
Glucose, Bld: 118 mg/dL — ABNORMAL HIGH (ref 70–99)
Potassium: 3.7 mmol/L (ref 3.5–5.1)
Sodium: 135 mmol/L (ref 135–145)

## 2021-04-07 LAB — RESP PANEL BY RT-PCR (FLU A&B, COVID) ARPGX2
Influenza A by PCR: NEGATIVE
Influenza B by PCR: NEGATIVE
SARS Coronavirus 2 by RT PCR: NEGATIVE

## 2021-04-07 LAB — CREATININE, SERUM
Creatinine, Ser: 0.73 mg/dL (ref 0.61–1.24)
GFR, Estimated: >60 mL/min (ref >60)

## 2021-04-07 LAB — SEDIMENTATION RATE: Sed Rate: 36 mm/hr — ABNORMAL HIGH (ref 0–16)

## 2021-04-07 MED ORDER — LOSARTAN POTASSIUM-HCTZ 50-12.5 MG PO TABS: 1.0000 | ORAL_TABLET | Freq: Every day | ORAL | Status: DC

## 2021-04-07 MED ORDER — HYDROCHLOROTHIAZIDE 12.5 MG PO TABS
12.5000 mg | ORAL_TABLET | Freq: Every day | ORAL | Status: DC
  Administered 2021-04-08: 12.5 mg via ORAL
  Filled 2021-04-07: qty 1

## 2021-04-07 MED ORDER — ZOLPIDEM TARTRATE 5 MG PO TABS
5.0000 mg | ORAL_TABLET | Freq: Every evening | ORAL | Status: DC | PRN
  Administered 2021-04-07: 5 mg via ORAL
  Filled 2021-04-07: qty 1

## 2021-04-07 MED ORDER — HYDROCHLOROTHIAZIDE 12.5 MG PO TABS: 12.5000 mg | ORAL_TABLET | Freq: Every day | ORAL | Status: DC

## 2021-04-07 MED ORDER — ENOXAPARIN SODIUM 40 MG/0.4ML IJ SOSY
40.0000 mg | PREFILLED_SYRINGE | INTRAMUSCULAR | Status: DC
  Administered 2021-04-07: 40 mg via SUBCUTANEOUS
  Filled 2021-04-07: qty 0.4

## 2021-04-07 MED ORDER — ONDANSETRON HCL 4 MG/2ML IJ SOLN: 4.0000 mg | Freq: Four times a day (QID) | INTRAMUSCULAR | Status: DC | PRN

## 2021-04-07 MED ORDER — LOSARTAN POTASSIUM 50 MG PO TABS
50.0000 mg | ORAL_TABLET | Freq: Every day | ORAL | Status: DC
  Administered 2021-04-08: 50 mg via ORAL
  Filled 2021-04-07: qty 1

## 2021-04-07 MED ORDER — VITAMIN D (ERGOCALCIFEROL) 1.25 MG (50000 UNIT) PO CAPS
50000.0000 [IU] | ORAL_CAPSULE | ORAL | Status: DC
Start: 2021-04-07 — End: 2021-04-08
  Administered 2021-04-07: 50000 [IU] via ORAL
  Filled 2021-04-07: qty 1

## 2021-04-07 MED ORDER — ASPIRIN 300 MG RE SUPP
300.0000 mg | RECTAL | Status: AC
  Filled 2021-04-07: qty 1

## 2021-04-07 MED ORDER — ASPIRIN EC 81 MG PO TBEC: 81.0000 mg | DELAYED_RELEASE_TABLET | Freq: Every day | ORAL | Status: DC

## 2021-04-07 MED ORDER — LOSARTAN POTASSIUM 50 MG PO TABS
50.0000 mg | ORAL_TABLET | Freq: Every day | ORAL | Status: DC
Start: 2021-04-07 — End: 2021-04-07

## 2021-04-07 MED ORDER — IBUPROFEN 400 MG PO TABS
800.0000 mg | ORAL_TABLET | Freq: Three times a day (TID) | ORAL | Status: DC
  Administered 2021-04-07 – 2021-04-08 (×2): 800 mg via ORAL
  Filled 2021-04-07 (×2): qty 2

## 2021-04-07 MED ORDER — NITROGLYCERIN 0.4 MG SL SUBL: 0.4000 mg | SUBLINGUAL_TABLET | SUBLINGUAL | Status: DC | PRN

## 2021-04-07 MED ORDER — ATORVASTATIN CALCIUM 10 MG PO TABS
10.0000 mg | ORAL_TABLET | Freq: Every day | ORAL | Status: DC
  Administered 2021-04-08: 10 mg via ORAL
  Filled 2021-04-07: qty 1

## 2021-04-07 MED ORDER — ACETAMINOPHEN 325 MG PO TABS: 650.0000 mg | ORAL_TABLET | ORAL | Status: DC | PRN

## 2021-04-07 MED ORDER — COLCHICINE 0.6 MG PO TABS
0.6000 mg | ORAL_TABLET | Freq: Two times a day (BID) | ORAL | Status: DC
  Administered 2021-04-07 – 2021-04-08 (×2): 0.6 mg via ORAL
  Filled 2021-04-07 (×2): qty 1

## 2021-04-07 MED ORDER — ASPIRIN 81 MG PO CHEW
324.0000 mg | CHEWABLE_TABLET | ORAL | Status: AC
  Administered 2021-04-07: 324 mg via ORAL
  Filled 2021-04-07: qty 4

## 2021-04-07 NOTE — ED Notes (Signed)
Report to floor and  and [facility] hee to get pot

## 2021-04-07 NOTE — H&P (Signed)
Cardiology Admission History and Physical:   Patient ID: Mark Powell MRN: [id number]; DOB: Jul 17, 1970   Admission date: 04/07/2021  PCP:  Pcp, No   [facility] Providers Cardiologist:  None        Chief Complaint:  CP  Patient Profile:   Mark Powell is a 50 y.o. 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 Marland Kitchen  History of Present Illness:   Mark Powell presented to the ED on 03-26-21 c/o viral syndrome with symptoms including HA, generalized myalgias, cough, SOB and pleuritic CP worse with deep breathing. He told the ED that night that he felt like his current sx were just like sx he had with his pericarditis in the past. He took ibuprofen and colchicine at home w/o relief. Workup in the ED that night was reassuring; he was discharged without any new medications or treatment. He re-presented to the ED today c/o the same symptoms, with CP that is worse with certain movements, deep breathing, coughing and sneezing. Pain worse when supine. Cardiology contacted by [facility] and advised pt be transferred to [facility] for admission. Pt is hemodynamically stable, afebrile.    Past Medical History:  Diagnosis Date   High cholesterol    Hypertension    Pericarditis     Past Surgical History:  Procedure Laterality Date   arm surgery     LEFT HEART CATH AND CORONARY ANGIOGRAPHY N/A 09/26/2016   Procedure: Left Heart Cath and Coronary Angiography;  Surgeon: Troy Sine, MD;  Location: [place] [facility];  Service: Cardiovascular;  Laterality: N/A;     Medications Prior to Admission: Prior to Admission medications   Medication Sig Start Date End Date Taking? Authorizing Provider  atorvastatin (LIPITOR) 10 MG tablet Take 10 mg by mouth daily.    [provider]  colchicine 0.6 MG tablet Take 1 tablet (0.6 mg total) by mouth daily. Take for 3 months.  08/26/19   Molpus, John, MD  losartan-hydrochlorothiazide (HYZAAR) 50-12.5 MG tablet Take 1 tablet by mouth daily.    [provider]  naproxen (NAPROSYN) 500 MG tablet Take 1 tablet (500 mg total) by mouth 2 (two) times daily with a meal. 08/26/19   Molpus, John, MD  Vitamin D, Ergocalciferol, (DRISDOL) 50000 units CAPS capsule Take 50,000 Units by mouth every 7 (seven) days.    [provider]     Allergies:   No Known Allergies  Social History:   Social History   Socioeconomic History   Marital status: Divorced    Spouse name: Not on file   Number of children: Not on file   Years of education: Not on file   Highest education level: Not on file  Occupational History   Not on file  Tobacco Use   Smoking status: Every Day   Smokeless tobacco: Never  Substance and Sexual Activity   Alcohol use: Yes    Comment: daily   Drug use: Not Currently    Types: Marijuana   Sexual activity: Not on file  Other Topics Concern   Not on file  Social History Narrative   Not on file   Social Determinants of Health   Financial Resource Strain: Not on file  Food Insecurity: Not on file  Transportation Needs: Not on file  Physical Activity: Not on file  Stress: Not on file  Social Connections: Not on file  Intimate Partner Violence: Not  on file    Family History:   The patient's family history includes Heart attack (age of onset: 84) in his brother.    ROS:  Please see the history of present illness.  All other ROS reviewed and negative.     Physical Exam/Data:   Vitals:   04/07/21 1748 04/07/21 1800 04/07/21 1830 04/07/21 2002  BP: (!) 140/94 (!) 128/95 (!) 122/94 (!) 142/89  Pulse: (!) 110 (!) 101 (!) 113 (!) 116  Resp: (!) 29 (!) 26 (!) 21 20  Temp:    98.4 F (36.9 C)  TempSrc:    Oral  SpO2: 99% 95% 98% 98%  Weight:    81.1 kg  Height:    6\' 2"  (1.88 m)   No intake or output data in the 24 hours ending 04/07/21 2103 Last 3 Weights 04/07/2021 04/07/2021  03/26/2021  Weight (lbs) 178 lb 12.7 oz 182 lb 180 lb  Weight (kg) 81.1 kg 82.555 kg 81.647 kg     Body mass index is 22.96 kg/m.  General:  Well nourished, well developed, in no acute distress HEENT: normal Neck: no JVD Vascular: No carotid bruits; Distal pulses 2+ bilaterally   Cardiac:  normal S1, S2; RRR; no murmur  Lungs:  clear to auscultation bilaterally, no wheezing, rhonchi or rales  Abd: soft, nontender, no hepatomegaly  Ext: no edema Musculoskeletal:  No deformities, Skin: warm and dry  Neuro:  no focal abnormalities noted Psych:  Normal affect    EKG:  The ECG that was done 04-07-21 was personally reviewed and demonstrates NSR with diffuse TWI, more prominent as compared to prior  Relevant CV Studies: 09-27-16 TTE Left ventricle: The cavity size was normal. Wall thickness was    increased in a pattern of mild LVH. Systolic function was normal.    The estimated ejection fraction was in the range of 55% to 60%.    Wall motion was normal; there were no regional wall motion    abnormalities. Left ventricular diastolic function parameters    were normal.  - Atrial septum: No defect or patent foramen ovale was identified.  09-26-16 LHC Normal coronaries  Laboratory Data:  High Sensitivity Troponin:   Recent Labs  Lab 03/26/21 1716 04/07/21 1436 04/07/21 1603  TROPONINIHS 3 3 3       Chemistry Recent Labs  Lab 04/07/21 1436  NA 135  K 3.7  CL 98  CO2 24  GLUCOSE 118*  BUN 17  CREATININE 0.75  CALCIUM 8.8*  GFRNONAA >60  ANIONGAP 13    No results for input(s): PROT, ALBUMIN, AST, ALT, ALKPHOS, BILITOT in the last 168 hours. Lipids No results for input(s): CHOL, TRIG, HDL, LABVLDL, LDLCALC, CHOLHDL in the last 168 hours. Hematology Recent Labs  Lab 04/07/21 1436  WBC 16.2*  RBC 5.60  HGB 13.2  HCT 41.4  MCV 73.9*  MCH 23.6*  MCHC 31.9  RDW 13.9  PLT 430*   Thyroid No results for input(s): TSH, FREET4 in the last 168 hours. BNPNo results for  input(s): BNP, PROBNP in the last 168 hours.  DDimer No results for input(s): DDIMER in the last 168 hours.   Radiology/Studies:  DG Chest 2 View  Result Date: 04/07/2021 CLINICAL DATA:  Chest pain EXAM: CHEST - 2 VIEW COMPARISON:  03/26/2021 FINDINGS: 1447 hours. Cardiopericardial silhouette is at upper limits of normal for size. Small left pleural effusion is new in the interval. Possible trace right pleural effusion. There is some atelectasis in the lung bases.  No pulmonary edema or pneumothorax. The visualized bony structures of the thorax show no acute abnormality. Telemetry leads overlie the chest. IMPRESSION: Small left pleural effusion with bibasilar atelectasis. Possible trace right effusion. Electronically Signed   By: Kennith Center M.D.   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 Pt had normal coronaries on echo in 2018. Will get TTE, start high-dose ibuprofen and BID colchicine. Will cycle HS troponin. Will get repeat EKG in the AM. Will leave NPO in case a decision is made to proceed w/ non-invasive ischemia evaluation. 2. HTN/dyslipidemia: restart home regimen  Risk Assessment/Risk Scores:    HEAR Score (for undifferentiated chest pain):  HEAR Score: 2       Severity of Illness: The appropriate patient status for this patient is OBSERVATION. Observation status is judged to be reasonable and necessary in order to provide the required intensity of service to ensure the patient's safety. The patient's presenting symptoms, physical exam findings, and initial radiographic and laboratory data in the context of their medical condition is felt to place them at decreased risk for further clinical deterioration. Furthermore, it is anticipated that the patient will be medically stable for discharge from the hospital within 2 midnights of admission.    For questions or updates,  please contact [facility] Please consult [email or web address] for contact info under     Signed, Precious Reel, MD, [facility] 04/07/2021 9:03 PM

## 2021-04-07 NOTE — ED Notes (Signed)
Pt notified of ready bed

## 2021-04-07 NOTE — ED Notes (Signed)
Pt. Reporting chest pain and pain with movement from left to right.  Pt. States he has had this pain before the last time he was here at med center.  Pt. Was seen at his PCP today and told to return to the ED for further Follow due to the nature of his pain.

## 2021-04-07 NOTE — ED Provider Notes (Signed)
[place] EMERGENCY DEPARTMENT Provider Note   CSN: [id number] Arrival date & time: 04/07/21  1356     History Chief Complaint  Patient presents with   Chest Pain    Mark Powell is a 50 y.o. male.  50 year old male with history of htn, pericarditis, hyperlipidemia. 3 days ago noted bilateral shoulder pain, applied icy hot. Last night around 10pm developed left sided chest pressure that was worse with coughing/sneezing/deep breaths (not feeling sick today, just occasional cough/sneezing). Went to PCP today where he had a CXR and EKG, concern for changes on his EKG and fluid in left side lungs and sent to the ER. Pain is worse with trying to lie supine, improves but does not resolve if sitting up/leaning forward. Seen here 2 weeks ago with similar symptoms, diagnosed with acute pericarditis and this feels similar.    Patient reports initial episode of pericarditis in 2018, admitted to the hospital at that time, and had a heart cath which was unremarkable, treated with colchicine and naproxen and his symptoms resolved.  Patient feels like he experiences recurrent symptoms nearly annually in the spring since that time (states that he did miss year 1 year but unsure when).  Patient reports return of his symptoms the week prior to Halloween, notes onset with stiffness in his shoulders with a left-sided chest pain.  Patient felt like he was experiencing his pericarditis and began taking the colchicine and ibuprofen.  Patient presented to the emergency room on October 29, his work-up was unremarkable and he was discharged after a dose of Toradol.  Patient states that he felt a little bit better after that episode however when he returned to work on November 1, his symptoms returned and he had to leave work early that day.  Patient has since run out of his colchicine, continue with his ibuprofen, after a few days his symptoms seemed to improve and then returned again this week.  Patient  went to his PCP today which prompted ER visit as above.      Past Medical History:  Diagnosis Date   High cholesterol    Hypertension    Pericarditis     Patient Active Problem List   Diagnosis Date Noted   Pericarditis 04/07/2021   Hypokalemia 10/12/2016   Acute pericarditis 09/27/2016   Chest pain 09/26/2016   HLD (hyperlipidemia) 09/26/2016   HTN (hypertension) 09/26/2016   Chest pain with high risk for cardiac etiology     Past Surgical History:  Procedure Laterality Date   arm surgery     LEFT HEART CATH AND CORONARY ANGIOGRAPHY N/A 09/26/2016   Procedure: Left Heart Cath and Coronary Angiography;  Surgeon: Troy Sine, MD;  Location: [place] [facility];  Service: Cardiovascular;  Laterality: N/A;       Family History  Problem Relation Age of Onset   Heart attack Brother 9    Social History   Tobacco Use   Smoking status: Every Day   Smokeless tobacco: Never  Substance Use Topics   Alcohol use: Yes    Comment: daily   Drug use: Not Currently    Types: Marijuana    Home Medications Prior to Admission medications   Medication Sig Start Date End Date Taking? Authorizing Provider  atorvastatin (LIPITOR) 10 MG tablet Take 10 mg by mouth daily.    [provider]  colchicine 0.6 MG tablet Take 1 tablet (0.6 mg total) by mouth daily. Take for 3 months. 08/26/19  Molpus, John, MD  losartan-hydrochlorothiazide (HYZAAR) 50-12.5 MG tablet Take 1 tablet by mouth daily.    [provider]  naproxen (NAPROSYN) 500 MG tablet Take 1 tablet (500 mg total) by mouth 2 (two) times daily with a meal. 08/26/19   Molpus, John, MD  Vitamin D, Ergocalciferol, (DRISDOL) 50000 units CAPS capsule Take 50,000 Units by mouth every 7 (seven) days.    [provider]    Allergies    Patient has no known allergies.  Review of Systems   Review of Systems  Constitutional:  Negative for chills and fever.  HENT:  Negative for congestion.   Respiratory:   Positive for shortness of breath. Negative for cough.   Cardiovascular:  Positive for chest pain.  Gastrointestinal:  Negative for abdominal pain, nausea and vomiting.  Musculoskeletal:  Positive for arthralgias.  Skin:  Negative for rash and wound.  Allergic/Immunologic: Negative for immunocompromised state.  Neurological:  Positive for headaches.  Psychiatric/Behavioral:  Negative for confusion.   All other systems reviewed and are negative.  Physical Exam Updated Vital Signs BP (!) 127/92   Pulse (!) 117   Temp 98.6 F (37 C) (Oral)   Resp (!) 21   Ht 6\' 2"  (1.88 m)   Wt 82.6 kg   SpO2 98%   BMI 23.37 kg/m   Physical Exam Vitals and nursing note reviewed.  Constitutional:      General: He is not in acute distress.    Appearance: He is well-developed. He is not diaphoretic.  HENT:     Head: Normocephalic and atraumatic.  Cardiovascular:     Rate and Rhythm: Regular rhythm. Tachycardia present.     Heart sounds: Normal heart sounds. No murmur heard.   No friction rub.  Pulmonary:     Effort: Tachypnea present.     Breath sounds: Examination of the left-middle field reveals decreased breath sounds. Examination of the left-lower field reveals decreased breath sounds. Decreased breath sounds present.  Chest:     Chest wall: No tenderness.  Abdominal:     Palpations: Abdomen is soft.     Tenderness: There is no abdominal tenderness.  Musculoskeletal:     Cervical back: Neck supple.     Right lower leg: No edema.     Left lower leg: No edema.  Skin:    General: Skin is warm and dry.  Neurological:     Mental Status: He is alert and oriented to person, place, and time.  Psychiatric:        Behavior: Behavior normal.    ED Results / Procedures / Treatments   Labs (all labs ordered are listed, but only abnormal results are displayed) Labs Reviewed  BASIC METABOLIC PANEL - Abnormal; Notable for the following components:      Result Value   Glucose, Bld 118 (*)     Calcium 8.8 (*)    All other components within normal limits  CBC - Abnormal; Notable for the following components:   WBC 16.2 (*)    MCV 73.9 (*)    MCH 23.6 (*)    Platelets 430 (*)    All other components within normal limits  RESP PANEL BY RT-PCR (FLU A&B, COVID) [id number]  C-REACTIVE PROTEIN  SEDIMENTATION RATE  TROPONIN I (HIGH SENSITIVITY)  TROPONIN I (HIGH SENSITIVITY)    EKG EKG Interpretation  Date/Time:  Thursday April 07 2021 14:01:57 EST Ventricular Rate:  116 PR Interval:  138 QRS Duration: 78 QT Interval:  326 QTC Calculation:  453 R Axis:   55 Text Interpretation: Sinus tachycardia Possible Left atrial enlargement ST & T wave abnormality, consider lateral ischemia Abnormal ECG Since last tracing rate faster Confirmed by Isla Pence [phone]) on 04/07/2021 2:09:07 PM  Radiology DG Chest 2 View  Result Date: 04/07/2021 CLINICAL DATA:  Chest pain EXAM: CHEST - 2 VIEW COMPARISON:  03/26/2021 FINDINGS: 1447 hours. Cardiopericardial silhouette is at upper limits of normal for size. Small left pleural effusion is new in the interval. Possible trace right pleural effusion. There is some atelectasis in the lung bases. No pulmonary edema or pneumothorax. The visualized bony structures of the thorax show no acute abnormality. Telemetry leads overlie the chest. IMPRESSION: Small left pleural effusion with bibasilar atelectasis. Possible trace right effusion. Electronically Signed   By: Misty Stanley M.D.   On: 04/07/2021 14:50    Procedures Procedures   Medications Ordered in ED Medications - No data to display  ED Course  I have reviewed the triage vital signs and the nursing notes.  Pertinent labs & imaging results that were available during my care of the patient were reviewed by me and considered in my medical decision making (see chart for details).  Clinical Course as of 04/07/21 1704  Thu Apr 08, 5391  8112 50 year old male presents with concern for recurrent  pericarditis.  Patient reports left-sided chest pain waxing and waning since mid-to-late October.  Seen in this ED on October 29 with reassuring work-up and discharged.  Patient saw PCP today and was told he had fluid in his left lung and an abnormal EKG and was sent to the ER. Pain is worse lying supine, improved sitting upright or leaning forward.  He is mildly short of breath and tachypneic after he speaks for long period of time. Maintains O2 sats in the upper 90s.  He is tachycardic in the 115 range.  EKG with lateral T wave changes.  Chest x-ray with left pleural effusion.  Review of labs, CBC with leukocytosis white count of 16,000, BMP without significant findings, troponin is 3. Case discussed with Dr. Tyrone Nine, ER attending, plan is to discuss further with cardiology. Is discussed with Dr. Debara Pickett on-call with cardiology who has reviewed record and EKG, recommends transfer to [facility] for admission and echo.  Requests add on sed rate and CRP. Discussed results and plan of care with patient who verbalizes understanding. [LM]    Clinical Course User Index [LM] Roque Lias   MDM Rules/Calculators/A&P                           Final Clinical Impression(s) / ED Diagnoses Final diagnoses:  Acute pericarditis, unspecified type    Rx / DC Orders ED Discharge Orders     None        Tacy Learn, PA-C 04/07/21 [place], DO 04/07/21 2000

## 2021-04-07 NOTE — ED Triage Notes (Addendum)
Went to PCP this morning for chest pain. Had EKG & chest Xray. PCP noted changes to EKG. Hx of pericarditis.   Pain with deep inspiration to chest, slight SOB. Had toradol injection at PCP without relief.

## 2021-04-08 ENCOUNTER — Ambulatory Visit (HOSPITAL_BASED_OUTPATIENT_CLINIC_OR_DEPARTMENT_OTHER): Payer: 59

## 2021-04-08 DIAGNOSIS — R9431 Abnormal electrocardiogram [ECG] [EKG]: Secondary | ICD-10-CM

## 2021-04-08 DIAGNOSIS — I300 Acute nonspecific idiopathic pericarditis: Secondary | ICD-10-CM | POA: Diagnosis not present

## 2021-04-08 DIAGNOSIS — I10 Essential (primary) hypertension: Secondary | ICD-10-CM | POA: Diagnosis not present

## 2021-04-08 DIAGNOSIS — R072 Precordial pain: Secondary | ICD-10-CM | POA: Diagnosis not present

## 2021-04-08 DIAGNOSIS — R079 Chest pain, unspecified: Secondary | ICD-10-CM

## 2021-04-08 LAB — CBC
HCT: 37.7 % — ABNORMAL LOW (ref 39.0–52.0)
Hemoglobin: 12.1 g/dL — ABNORMAL LOW (ref 13.0–17.0)
MCH: 23.7 pg — ABNORMAL LOW (ref 26.0–34.0)
MCHC: 32.1 g/dL (ref 30.0–36.0)
MCV: 73.9 fL — ABNORMAL LOW (ref 80.0–100.0)
Platelets: 403 10*3/uL — ABNORMAL HIGH (ref 150–400)
RBC: 5.1 MIL/uL (ref 4.22–5.81)
RDW: 13.5 % (ref 11.5–15.5)
WBC: 12.7 10*3/uL — ABNORMAL HIGH (ref 4.0–10.5)
nRBC: 0 % (ref 0.0–0.2)

## 2021-04-08 LAB — BASIC METABOLIC PANEL
Anion gap: 10 (ref 5–15)
BUN: 13 mg/dL (ref 6–20)
CO2: 25 mmol/L (ref 22–32)
Calcium: 8.5 mg/dL — ABNORMAL LOW (ref 8.9–10.3)
Chloride: 101 mmol/L (ref 98–111)
Creatinine, Ser: 0.67 mg/dL (ref 0.61–1.24)
GFR, Estimated: >60 mL/min (ref >60)
Glucose, Bld: 95 mg/dL (ref 70–99)
Potassium: 3.5 mmol/L (ref 3.5–5.1)
Sodium: 136 mmol/L (ref 135–145)

## 2021-04-08 LAB — LIPID PANEL
Cholesterol: 141 mg/dL (ref 0–200)
HDL: 33 mg/dL — ABNORMAL LOW (ref >40)
LDL Cholesterol: 92 mg/dL (ref 0–99)
Total CHOL/HDL Ratio: 4.3 RATIO
Triglycerides: 81 mg/dL (ref <150)
VLDL: 16 mg/dL (ref 0–40)

## 2021-04-08 LAB — ECHOCARDIOGRAM COMPLETE
Area-P 1/2: 4.63 cm2
Height: 74 in
S' Lateral: 2.7 cm
Weight: 2860.69 oz

## 2021-04-08 LAB — HIV ANTIBODY (ROUTINE TESTING W REFLEX): HIV Screen 4th Generation wRfx: NONREACTIVE

## 2021-04-08 LAB — C-REACTIVE PROTEIN: CRP: 21.4 mg/dL — ABNORMAL HIGH (ref <1.0)

## 2021-04-08 MED ORDER — PANTOPRAZOLE SODIUM 40 MG PO TBEC
40.0000 mg | DELAYED_RELEASE_TABLET | Freq: Every day | ORAL | Status: DC
  Administered 2021-04-08: 40 mg via ORAL
  Filled 2021-04-08: qty 1

## 2021-04-08 MED ORDER — IBUPROFEN 800 MG PO TABS: 800.0000 mg | ORAL_TABLET | Freq: Three times a day (TID) | ORAL | 3 refills | Status: DC

## 2021-04-08 MED ORDER — COLCHICINE 0.6 MG PO TABS: 0.6000 mg | ORAL_TABLET | Freq: Two times a day (BID) | ORAL | 4 refills | Status: DC

## 2021-04-08 MED ORDER — PANTOPRAZOLE SODIUM 40 MG PO TBEC: 40.0000 mg | DELAYED_RELEASE_TABLET | Freq: Every day | ORAL | 1 refills | Status: DC

## 2021-04-08 NOTE — Progress Notes (Signed)
  Echocardiogram 2D Echocardiogram has been performed.  Delcie Roch 04/08/2021, 11:40 AM

## 2021-04-08 NOTE — Discharge Summary (Signed)
Discharge Summary  Patient ID: Mark Powell MRN: [id number] DOB: Jan 23, 1971  Admit date: 04/07/2021 Discharge date: 04/08/2021  Primary Care Provider: Pcp, No  Primary Cardiologist: None  Primary Electrophysiologist:  None   Discharge Diagnoses  Principal Problem:   Pericarditis Active Problems:   Chest pain   HTN (hypertension)   Allergies No Known Allergies  Diagnostic Studies/Procedures  Echo demonstrated normal LV and RV function, normal diastolic parameters, no significant pericardial effusion   History of Present Illness    50 year old male with history of recurrent pericarditis who was admitted on 04/07/2021 with recurrent pericarditis.  Hospital Course  Mark Powell is a 50 y.o. male who was diagnosed with pericarditis in 2018.  He informed me he has had at least 4 recurrent episodes of pericarditis since that time.  He underwent left heart catheterization in 2018 that showed no evidence of CAD.  Apparently he has been treated on and off for episodes of pericarditis since that time.  He informs me this is at least his fourth episode.  Symptoms started roughly 1 month ago.  He describes tightness in his chest that is worse with laying back and better with leaning forward.  He reports he started taking ibuprofen but symptoms not improved.  He went to the emergency room and he was hemodynamically stable.  Cardiac enzymes were negative.  Inflammatory markers were elevated with CRP of 21.4 and sed rate of 36.  White count also elevated at 13.6.  COVID-19 test was negative.  Kidney function was normal.  Cholesterol within limits.  EKG demonstrated sinus rhythm with no acute ischemic changes or evidence of infarction.  He was started on high-dose ibuprofen as well as colchicine.  Symptoms improved.  Echocardiogram showed normal LV function without significant abnormality.  #Recurrent pericarditis -He describes 4 episodes over the past few years.  Initial episode was in  2018. -To me he has not been treated effectively with appropriate anti-inflammatory agents. -Given recurrent episode we will start with extended taper ibuprofen.  We will start with 800 mg 3 times daily for 21 days, 100 mg twice daily for 21 days, 100 mg daily for 21 days and stop. -He will also take colchicine 0.6 mg twice daily. -We will place him on Protonix 40 mg daily while on high-dose NSAIDs. -I will see him back in the office.  We will see how he does with the NSAID taper.   -If he fails NSAID taper we will consider steroids versus interleukin-1 inhibitor.  We will follow his CRP and symptoms as an outpatient.  Consultants: None  Physical Exam/Data:   Vitals:   04/07/21 2208 04/07/21 2306 04/08/21 0303 04/08/21 0807  BP: (!) 129/96 (!) 133/93 135/90 123/89  Pulse: (!) 106 100 90 97  Resp: 20 19 20  (!) 24  Temp: 99.1 F (37.3 C) 98 F (36.7 C) (!) 97.4 F (36.3 C) 97.6 F (36.4 C)  TempSrc: Oral Oral Oral Oral  SpO2: 94% 91% 99% 100%  Weight:      Height:        Intake/Output Summary (Last 24 hours) at 04/08/2021 1246 Last data filed at 04/08/2021 1155 Gross per 24 hour  Intake 957 ml  Output --  Net 957 ml    Last 3 Weights 04/07/2021 04/07/2021 03/26/2021  Weight (lbs) 178 lb 12.7 oz 182 lb 180 lb  Weight (kg) 81.1 kg 82.555 kg 81.647 kg    Body mass index is 22.96 kg/m.  General: Well nourished, well developed, in no acute  distress Head: Atraumatic, normal size  Eyes: PEERLA, EOMI  Neck: Supple, no JVD Endocrine: No thryomegaly Cardiac: Normal S1, S2; RRR; no murmurs, rubs, or gallops Lungs: Clear to auscultation bilaterally, no wheezing, rhonchi or rales  Abd: Soft, nontender, no hepatomegaly  Ext: No edema, pulses 2+ Musculoskeletal: No deformities, BUE and BLE strength normal and equal Skin: Warm and dry, no rashes   Neuro: Alert and oriented to person, place, time, and situation, CNII-XII grossly intact, no focal deficits  Psych: Normal mood and affect    Labs & Radiologic Studies  CBC Recent Labs    04/07/21 2106 04/08/21 0201  WBC 13.6* 12.7*  HGB 12.8* 12.1*  HCT 39.8 37.7*  MCV 73.8* 73.9*  PLT 413* 403*   Basic Metabolic Panel Recent Labs    70/62/37 1436 04/07/21 2106 04/08/21 0201  NA 135  --  136  K 3.7  --  3.5  CL 98  --  101  CO2 24  --  25  GLUCOSE 118*  --  95  BUN 17  --  13  CREATININE 0.75 0.73 0.67  CALCIUM 8.8*  --  8.5*   Liver Function Tests No results for input(s): AST, ALT, ALKPHOS, BILITOT, PROT, ALBUMIN in the last 72 hours. No results for input(s): LIPASE, AMYLASE in the last 72 hours. Cardiac Enzymes No results for input(s): CKTOTAL, CKMB, CKMBINDEX, TROPONINI in the last 72 hours. BNP Invalid input(s): POCBNP D-Dimer No results for input(s): DDIMER in the last 72 hours. Hemoglobin A1C No results for input(s): HGBA1C in the last 72 hours. Fasting Lipid Panel Recent Labs    04/08/21 0201  CHOL 141  HDL 33*  LDLCALC 92  TRIG 81  CHOLHDL 4.3   Thyroid Function Tests No results for input(s): TSH, T4TOTAL, T3FREE, THYROIDAB in the last 72 hours.  Invalid input(s): FREET3 _____________  DG Chest 2 View  Result Date: 04/07/2021 CLINICAL DATA:  Chest pain EXAM: CHEST - 2 VIEW COMPARISON:  03/26/2021 FINDINGS: 1447 hours. Cardiopericardial silhouette is at upper limits of normal for size. Small left pleural effusion is new in the interval. Possible trace right pleural effusion. There is some atelectasis in the lung bases. No pulmonary edema or pneumothorax. The visualized bony structures of the thorax show no acute abnormality. Telemetry leads overlie the chest. IMPRESSION: Small left pleural effusion with bibasilar atelectasis. Possible trace right effusion. Electronically Signed   By: Kennith Center M.D.   On: 04/07/2021 14:50   DG Chest 2 View  Result Date: 03/26/2021 CLINICAL DATA:  Shortness of breath and body aches. EXAM: CHEST - 2 VIEW COMPARISON:  September 04, 2020 FINDINGS: Mild,  stable linear scarring and/or atelectasis is noted within the left lung base. There is no evidence of acute infiltrate, pleural effusion or pneumothorax. The heart size and mediastinal contours are within normal limits. The visualized skeletal structures are unremarkable. IMPRESSION: No active cardiopulmonary disease. Electronically Signed   By: Aram Candela M.D.   On: 03/26/2021 17:29   Disposition  Pt is being discharged home today in good condition.  Follow-up Plans & Appointments    Follow-up Information     O'Neal, Ronnald Ramp, MD Follow up on 04/19/2021.   Specialties: Cardiology, Internal Medicine, Radiology Why: at 3:00 PM Contact information: [address] [place] [address] [phone]                For the Ibuprofen 800 mg every 8 hours for 21 days-last day 04/29/21 Ibuprofen 800 mg twice a  day for 21 days begins 04/30/21 and ends 05/20/21 Ibuprofen 800 mg daily for 21 days begins 05/21/21 and ends 06/10/21  Added colchicine and protonix as well.  Heart Healthy diet   Keep follow up appt.   Discharge Medications   Allergies as of 04/08/2021   No Known Allergies      Medication List     STOP taking these medications    naproxen 500 MG tablet Commonly known as: NAPROSYN       TAKE these medications    atorvastatin 10 MG tablet Commonly known as: LIPITOR Take 10 mg by mouth daily.   colchicine 0.6 MG tablet Take 1 tablet (0.6 mg total) by mouth 2 (two) times daily. What changed:  when to take this additional instructions   ibuprofen 800 MG tablet Commonly known as: ADVIL Take 1 tablet (800 mg total) by mouth 3 (three) times daily. For 21 days-, then take 800 mg twice a day for 21 days, then 800 mg daily for 21 days.   losartan-hydrochlorothiazide 50-12.5 MG tablet Commonly known as: HYZAAR Take 1 tablet by mouth daily.   pantoprazole 40 MG tablet Commonly known as: PROTONIX Take 1 tablet (40 mg total) by mouth  daily. Start taking on: April 09, 2021   Vitamin D (Ergocalciferol) 1.25 MG (50000 UNIT) Caps capsule Commonly known as: DRISDOL Take 50,000 Units by mouth every 7 (seven) days.         No                               Did the patient have a percutaneous coronary intervention (stent / angioplasty)?:  No.     Outstanding Labs/Studies  Consider sed rate  Duration of Discharge Encounter   Time Spent with Patient: I have spent a total of 45 minutes with patient reviewing hospital notes, telemetry, EKGs, labs and examining the patient as well as establishing an assessment and plan that was discussed with the patient.  > 50% of time was spent in direct patient care.  Signed, Addison Naegeli. Audie Box, MD, [place]  04/08/2021 12:46 PM

## 2021-04-08 NOTE — Discharge Instructions (Addendum)
For the Ibuprofen 800 mg every 8 hours for 21 days-last day 04/29/21 Ibuprofen 800 mg twice a day for 21 days begins 04/30/21 and ends 05/20/21 Ibuprofen 800 mg daily for 21 days begins 05/21/21 and ends 06/10/21  Added colchicine and protonix as well.  Heart Healthy diet   Keep follow up appt.

## 2021-04-17 NOTE — Progress Notes (Signed)
Cardiology Office Note:   Date:  04/19/2021  NAME:  Mark Powell    MRN: [id number] DOB:  [birth year]   PCP:  Colonel Bald, MD  Cardiologist:  None  Electrophysiologist:  None   Referring MD: No ref. provider found   Chief Complaint  Patient presents with   Follow-up    History of Present Illness:   Mark Powell is a 50 y.o. male with a hx of HTN, HLD, pericarditis who presents for follow-up. Diagnosed with pericarditis in 2018. Has had several recurrences but not treated appropriately. Admitted 11/10-11/11 for recurrent pericarditis. He reports he is doing well since leaving the hospital.  He is on an extended taper of ibuprofen for recurrent pericarditis.  He is on Protonix.  No bleeding reported.  His EKG has normalized.  He reports no chest pain or trouble breathing.  Blood pressure 142/82.  He is followed by his primary care physician at [facility] in [facility].  We delineated the time course of his pericarditis diagnosis.  He was apparently diagnosed in 2018.  He presented back to the emergency room in 2021 where he was given a shot of Toradol for recurrent symptoms of pericarditis.  He was then seen in April 2022 and given a short course of naproxen.  He was seen initially in the hospital in October 2022 and given no medications.  This resulted in him being hospitalized for recurrent pericarditis in early November 2022.  He has never really been treated aggressively for recurrent pericarditis.  We discussed extended course of ibuprofen.  I think this is the best course of action before we pursue steroids or even biologic agents.  His echocardiogram showed normal LV function.  He is overall in agreement with a trial of ibuprofen first.  He is a current everyday smoker.  He is smoking 3 to 4 cigarettes/day.  I recommended he quit.  He smoked for 34 years.  Left heart catheterization in 2018 was normal.  He reports his brother may have a pacemaker.  No other strong family  history of heart disease.  Lab work from primary care physician demonstrates normal LDL cholesterol 92.  No prior history of heart attack or stroke.  He is single.  No children.  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  CRP 21.4 ESR 36  Problem List Pericarditis -Initial Dx 09/2016 2. HTN  Past Medical History: Past Medical History:  Diagnosis Date   High cholesterol    Hypertension    Pericarditis     Past Surgical History: Past Surgical History:  Procedure Laterality Date   arm surgery     LEFT HEART CATH AND CORONARY ANGIOGRAPHY N/A 09/26/2016   Procedure: Left Heart Cath and Coronary Angiography;  Surgeon: Troy Sine, MD;  Location: [facility];  Service: Cardiovascular;  Laterality: N/A;    Current Medications: Current Meds  Medication Sig   colchicine 0.6 MG tablet Take 1 tablet (0.6 mg total) by mouth 2 (two) times daily.   ibuprofen (ADVIL) 800 MG tablet Take 1 tablet (800 mg total) by mouth 3 (three) times daily. For 21 days-, then take 800 mg twice a day for 21 days, then 800 mg daily for 21 days.   losartan-hydrochlorothiazide (HYZAAR) 50-12.5 MG tablet Take 1 tablet by mouth daily.   pantoprazole (PROTONIX) 40 MG tablet Take 1 tablet (40 mg total) by mouth daily.   [DISCONTINUED] atorvastatin (LIPITOR) 10 MG tablet Take 10  mg by mouth daily.   [DISCONTINUED] Vitamin D, Ergocalciferol, (DRISDOL) 50000 units CAPS capsule Take 50,000 Units by mouth every 7 (seven) days.     Allergies:    Patient has no known allergies.   Social History: Social History   Socioeconomic History   Marital status: Single    Spouse name: Not on file   Number of children: 0   Years of education: Not on file   Highest education level: Not on file  Occupational History   Occupation: Materials engineer - [employer]  Tobacco Use   Smoking status: Every Day    Packs/day: 0.25    Years: 34.00    Pack years: 8.50    Types: Cigarettes    Smokeless tobacco: Never  Substance and Sexual Activity   Alcohol use: Yes    Comment: daily   Drug use: Not Currently   Sexual activity: Not on file  Other Topics Concern   Not on file  Social History Narrative   Not on file   Social Determinants of Health   Financial Resource Strain: Not on file  Food Insecurity: Not on file  Transportation Needs: Not on file  Physical Activity: Not on file  Stress: Not on file  Social Connections: Not on file     Family History: The patient's family history includes Heart attack (age of onset: 58) in his brother.  ROS:   All other ROS reviewed and negative. Pertinent positives noted in the HPI.     EKGs/Labs/Other Studies Reviewed:   The following studies were personally reviewed by me today:  EKG:  EKG is ordered today.  The ekg ordered today demonstrates normal sinus rhythm heart rate 76, nonspecific ST-T changes, and was personally reviewed by me.   TTE 04/08/2021  1. Left ventricular ejection fraction, by estimation, is 65 to 70%. The  left ventricle has normal function. The left ventricle has no regional  wall motion abnormalities. Left ventricular diastolic parameters were  normal.   2. Right ventricular systolic function is normal. The right ventricular  size is normal. Tricuspid regurgitation signal is inadequate for assessing  PA pressure.   3. Moderate pleural effusion.   4. The mitral valve is normal in structure. No evidence of mitral valve  regurgitation.   5. The aortic valve is normal in structure. Aortic valve regurgitation is  not visualized. No aortic stenosis is present.   6. Aortic midl ascending aortic aneurysm 3.5 cm.   7. The inferior vena cava is normal in size with greater than 50%  respiratory variability, suggesting right atrial pressure of 3 mmHg.   [place] 09/26/2016 Normal coronaries   Recent Labs: 04/08/2021: BUN 13; Creatinine, Ser 0.67; Hemoglobin 12.1; Platelets 403; Potassium 3.5; Sodium 136    Recent Lipid Panel    Component Value Date/Time   CHOL 141 04/08/2021 0201   TRIG 81 04/08/2021 0201   HDL 33 (L) 04/08/2021 0201   CHOLHDL 4.3 04/08/2021 0201   VLDL 16 04/08/2021 0201   LDLCALC 92 04/08/2021 0201    Physical Exam:   VS:  BP (!) 142/82   Pulse 76   Ht '6\' 2"'  (1.88 m)   Wt 178 lb 6.4 oz (80.9 kg)   SpO2 100%   BMI 22.91 kg/m    Wt Readings from Last 3 Encounters:  04/19/21 178 lb 6.4 oz (80.9 kg)  04/07/21 178 lb 12.7 oz (81.1 kg)  03/26/21 180 lb (81.6 kg)    General: Well nourished, well developed, in  no acute distress Head: Atraumatic, normal size  Eyes: PEERLA, EOMI  Neck: Supple, no JVD Endocrine: No thryomegaly Cardiac: Normal S1, S2; RRR; no murmurs, rubs, or gallops Lungs: Clear to auscultation bilaterally, no wheezing, rhonchi or rales  Abd: Soft, nontender, no hepatomegaly  Ext: No edema, pulses 2+ Musculoskeletal: No deformities, BUE and BLE strength normal and equal Skin: Warm and dry, no rashes   Neuro: Alert and oriented to person, place, time, and situation, CNII-XII grossly intact, no focal deficits  Psych: Normal mood and affect   ASSESSMENT:   Mark Powell is a 50 y.o. male who presents for the following: 1. Recurrent idiopathic pericarditis   2. Tobacco abuse     PLAN:   1. Recurrent idiopathic pericarditis -Initially diagnosed with pericarditis in 2018.  Treated with a short course of NSAIDs that improved his symptoms.  Has had several recurrences that have not been appropriately treated in my opinion.  He had an episode in 2021 and 2 episodes in 2022.  This eventually resulted in him being hospitalized in early November 2022. -Recent echocardiogram shows normal LV function.  Troponins were negative and no suggestion of myocarditis. -We have settled on an extended course of ibuprofen.  He will take 800 mg 3 times daily for 21 days, 80 mg twice daily for 21 days and then 800 mg daily for 21 days.  He will take colchicine 0.6 mg  twice daily for at least 6 months.  He is also been placed on Protonix 40 mg daily while he is on high-dose NSAIDs. -I will plan to see him back in 6 weeks for repeat inflammatory markers.  He will let us know if his symptoms return.  We may need to pursue steroids if this does occur.  We also could consider him for biologic agents if it recurs while on extended ibuprofen therapy.  2. Tobacco abuse -4 minutes of smoking cessation counseling was provided in office today.      Disposition: Return in about 6 weeks (around 05/31/2021).  Medication Adjustments/Labs and Tests Ordered: Current medicines are reviewed at length with the patient today.  Concerns regarding medicines are outlined above.  Orders Placed This Encounter  Procedures   EKG 12-Lead    No orders of the defined types were placed in this encounter.   Patient Instructions  Medication Instructions:  The current medical regimen is effective;  continue present plan and medications.  *If you need a refill on your cardiac medications before your next appointment, please call your pharmacy*  Follow-Up: At [facility], you and your health needs are our priority.  As part of our continuing mission to provide you with exceptional heart care, we have created designated Provider Care Teams.  These Care Teams include your primary Cardiologist (physician) and Advanced Practice Providers (APPs -  Physician Assistants and Nurse Practitioners) who all work together to provide you with the care you need, when you need it.  We recommend signing up for the patient portal called "MyChart".  Sign up information is provided on this After Visit Summary.  MyChart is used to connect with patients for Virtual Visits (Telemedicine).  Patients are able to view lab/test results, encounter notes, upcoming appointments, etc.  Non-urgent messages can be sent to your provider as well.   To learn more about what you can do with MyChart, go to  [email or web address].    Your next appointment:   January 9th at 2:30 PM  The format for your next appointment:  In Person  Provider:   Eleonore Chiquito, MD     Time Spent with Patient: I have spent a total of 25 minutes with patient reviewing hospital notes, telemetry, EKGs, labs and examining the patient as well as establishing an assessment and plan that was discussed with the patient.  > 50% of time was spent in direct patient care.  Signed, Addison Naegeli. Audie Box, MD, [place]  [address] [place], [place] [address] [phone]  04/19/2021 3:35 PM

## 2021-04-19 ENCOUNTER — Other Ambulatory Visit: Payer: Self-pay

## 2021-04-19 ENCOUNTER — Encounter: Payer: Self-pay | Admitting: Cardiovascular Disease

## 2021-04-19 ENCOUNTER — Ambulatory Visit (INDEPENDENT_AMBULATORY_CARE_PROVIDER_SITE_OTHER): Payer: 59 | Admitting: Cardiovascular Disease

## 2021-04-19 VITALS — BP 142/82 | HR 76 | Ht 74.0 in | Wt 178.4 lb

## 2021-04-19 DIAGNOSIS — Z720 Tobacco use: Secondary | ICD-10-CM

## 2021-04-19 DIAGNOSIS — I300 Acute nonspecific idiopathic pericarditis: Secondary | ICD-10-CM | POA: Diagnosis not present

## 2021-04-19 NOTE — Patient Instructions (Signed)
Medication Instructions:  The current medical regimen is effective;  continue present plan and medications.  *If you need a refill on your cardiac medications before your next appointment, please call your pharmacy*  Follow-Up: At [facility], you and your health needs are our priority.  As part of our continuing mission to provide you with exceptional heart care, we have created designated Provider Care Teams.  These Care Teams include your primary Cardiologist (physician) and Advanced Practice Providers (APPs -  Physician Assistants and Nurse Practitioners) who all work together to provide you with the care you need, when you need it.  We recommend signing up for the patient portal called "MyChart".  Sign up information is provided on this After Visit Summary.  MyChart is used to connect with patients for Virtual Visits (Telemedicine).  Patients are able to view lab/test results, encounter notes, upcoming appointments, etc.  Non-urgent messages can be sent to your provider as well.   To learn more about what you can do with MyChart, go to [email or web address].    Your next appointment:   January 9th at 2:30 PM  The format for your next appointment:   In Person  Provider:   Lennie Odor, MD

## 2021-06-03 NOTE — Progress Notes (Signed)
Cardiology Office Note:   Date:  06/06/2021  NAME:  Mark Powell    MRN: [id number] DOB:  Jun 10, 1970   PCP:  Mark Bald, MD  Cardiologist:  None  Electrophysiologist:  None   Referring MD: Mark Bald, MD   Chief Complaint  Patient presents with   Follow-up        History of Present Illness:   Mark Powell is a 51 y.o. male with a hx of recurrent idiopathic pericarditis who presents for follow-up.  Evaluated in November for recurrent pericarditis.  Had not been appropriately treated.  We prescribed an extended course of ibuprofen as well as colchicine therapy.  Plan to repeat inflammatory markers today. He reports he is doing well.  Currently on ibuprofen 800 mg twice a day.  Denies any recurrence of his chest pain.  No bleeding.  He is on Protonix.  He is also on colchicine.  He is still smoking.  Roughly 1 pack every 3 to 4 days.  He is working on quitting.  He works for an [employer].  They are relocating to [place].  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  We have plans to recheck his inflammatory markers.  Overall doing well.  No further signs of recurrence of his pericarditis.  Problem List Pericarditis -Initial Dx 09/2016 -recurrence 03/2021 -LHC 09/2016 -> normal 2. HTN 3. Tobacco abuse   Past Medical History: Past Medical History:  Diagnosis Date   High cholesterol    Hypertension    Pericarditis     Past Surgical History: Past Surgical History:  Procedure Laterality Date   arm surgery     LEFT HEART CATH AND CORONARY ANGIOGRAPHY N/A 09/26/2016   Procedure: Left Heart Cath and Coronary Angiography;  Surgeon: Mark Sine, MD;  Location: [facility];  Service: Cardiovascular;  Laterality: N/A;    Current Medications: Current Meds  Medication Sig   colchicine 0.6 MG tablet Take 1 tablet (0.6 mg total) by mouth 2 (two) times daily.   ibuprofen (ADVIL) 800 MG tablet Take 1 tablet (800 mg total) by mouth  3 (three) times daily. For 21 days-, then take 800 mg twice a day for 21 days, then 800 mg daily for 21 days.   losartan-hydrochlorothiazide (HYZAAR) 50-12.5 MG tablet Take 1 tablet by mouth daily.   [DISCONTINUED] pantoprazole (PROTONIX) 40 MG tablet Take 1 tablet (40 mg total) by mouth daily.     Allergies:    Patient has no known allergies.   Social History: Social History   Socioeconomic History   Marital status: Single    Spouse name: Not on file   Number of children: 0   Years of education: Not on file   Highest education level: Not on file  Occupational History   Occupation: Materials engineer - [employer]  Tobacco Use   Smoking status: Every Day    Packs/day: 0.25    Years: 34.00    Pack years: 8.50    Types: Cigarettes   Smokeless tobacco: Never  Substance and Sexual Activity   Alcohol use: Yes    Comment: daily   Drug use: Not Currently   Sexual activity: Not on file  Other Topics Concern   Not on file  Social History Narrative   Not on file   Social Determinants of Health   Financial Resource Strain: Not on file  Food Insecurity: Not on file  Transportation Needs: Not on file  Physical Activity: Not on file  Stress: Not on file  Social Connections: Not on file     Family History: The patient's family history includes Heart attack (age of onset: 79) in his brother.  ROS:   All other ROS reviewed and negative. Pertinent positives noted in the HPI.     EKGs/Labs/Other Studies Reviewed:   The following studies were personally reviewed by me today:  TTE 04/08/2021  1. Left ventricular ejection fraction, by estimation, is 65 to 70%. The  left ventricle has normal function. The left ventricle has no regional  wall motion abnormalities. Left ventricular diastolic parameters were  normal.   2. Right ventricular systolic function is normal. The right ventricular  size is normal. Tricuspid regurgitation signal is inadequate for assessing  PA pressure.   3.  Moderate pleural effusion.   4. The mitral valve is normal in structure. No evidence of mitral valve  regurgitation.   5. The aortic valve is normal in structure. Aortic valve regurgitation is  not visualized. No aortic stenosis is present.   6. Aortic midl ascending aortic aneurysm 3.5 cm.   7. The inferior vena cava is normal in size with greater than 50%  respiratory variability, suggesting right atrial pressure of 3 mmHg.   Recent Labs: 04/08/2021: BUN 13; Creatinine, Ser 0.67; Hemoglobin 12.1; Platelets 403; Potassium 3.5; Sodium 136   Recent Lipid Panel    Component Value Date/Time   CHOL 141 04/08/2021 0201   TRIG 81 04/08/2021 0201   HDL 33 (L) 04/08/2021 0201   CHOLHDL 4.3 04/08/2021 0201   VLDL 16 04/08/2021 0201   LDLCALC 92 04/08/2021 0201    Physical Exam:   VS:  BP (!) 143/78    Pulse 97    Ht _0  (1.88 m)    Wt 173 lb 6.4 oz (78.7 kg)    SpO2 98%    BMI 22.26 kg/m    Wt Readings from Last 3 Encounters:  06/06/21 173 lb 6.4 oz (78.7 kg)  04/19/21 178 lb 6.4 oz (80.9 kg)  04/07/21 178 lb 12.7 oz (81.1 kg)    General: Well nourished, well developed, in no acute distress Head: Atraumatic, normal size  Eyes: PEERLA, EOMI  Neck: Supple, no JVD Endocrine: No thryomegaly Cardiac: Normal S1, S2; RRR; no murmurs, rubs, or gallops Lungs: Clear to auscultation bilaterally, no wheezing, rhonchi or rales  Abd: Soft, nontender, no hepatomegaly  Ext: No edema, pulses 2+ Musculoskeletal: No deformities, BUE and BLE strength normal and equal Skin: Warm and dry, no rashes   Neuro: Alert and oriented to person, place, time, and situation, CNII-XII grossly intact, no focal deficits  Psych: Normal mood and affect   ASSESSMENT:   Mark Powell is a 51 y.o. male who presents for the following: 1. Recurrent idiopathic pericarditis   2. Tobacco abuse     PLAN:   1. Recurrent idiopathic pericarditis -Initially diagnosed with pericarditis in May 2018.  Had recurrence in  November 2022.  He had symptoms periodically in 2022 but was never treated definitively. -He has been started on an extended ibuprofen taper.  Plan was 800 mg ibuprofen 3 times daily for 21 days followed by 800 mg twice a day for 21 days followed by 800 mg daily for 21 days.  He is on Protonix 40 mg daily.  We have plans to complete 6 months of colchicine. -CRP and ESR were elevated.  We will recheck his lab work today.  Hopefully his inflammatory markers have normalized. -Echo shows normal LV function. -  I will see him back in 3 months.  Hopefully this extended ibuprofen taper will treat his pericarditis definitively.  2. Tobacco abuse -Still smoking a few cigarettes daily.  4 minutes of smoking cessation counseling was provided in office today.  Disposition: Return in about 3 months (around 09/04/2021).  Medication Adjustments/Labs and Tests Ordered: Current medicines are reviewed at length with the patient today.  Concerns regarding medicines are outlined above.  Orders Placed This Encounter  Procedures   Sedimentation rate   C-reactive protein   Meds ordered this encounter  Medications   pantoprazole (PROTONIX) 40 MG tablet    Sig: Take 1 tablet (40 mg total) by mouth daily.    Dispense:  90 tablet    Refill:  3    Patient Instructions  Medication Instructions:  The current medical regimen is effective;  continue present plan and medications.  *If you need a refill on your cardiac medications before your next appointment, please call your pharmacy*   Lab Work: CRP, ESR today   If you have labs (blood work) drawn today and your tests are completely normal, you will receive your results only by: [place] (if you have MyChart) OR A paper copy in the mail If you have any lab test that is abnormal or we need to change your treatment, we will call you to review the results.  Follow-Up: At [facility], you and your health needs are our priority.  As part of our  continuing mission to provide you with exceptional heart care, we have created designated Provider Care Teams.  These Care Teams include your primary Cardiologist (physician) and Advanced Practice Providers (APPs -  Physician Assistants and Nurse Practitioners) who all work together to provide you with the care you need, when you need it.  We recommend signing up for the patient portal called "MyChart".  Sign up information is provided on this After Visit Summary.  MyChart is used to connect with patients for Virtual Visits (Telemedicine).  Patients are able to view lab/test results, encounter notes, upcoming appointments, etc.  Non-urgent messages can be sent to your provider as well.   To learn more about what you can do with MyChart, go to [email or web address].    Your next appointment:   3 month(s)  The format for your next appointment:   In Person  Provider:   Eleonore Chiquito, MD      Time Spent with Patient: I have spent a total of 35 minutes with patient reviewing hospital notes, telemetry, EKGs, labs and examining the patient as well as establishing an assessment and plan that was discussed with the patient.  > 50% of time was spent in direct patient care.  Signed, Addison Naegeli. Audie Box, MD, [place]  [address] [place], [place] [address] [phone]  06/06/2021 3:22 PM

## 2021-06-06 ENCOUNTER — Ambulatory Visit (INDEPENDENT_AMBULATORY_CARE_PROVIDER_SITE_OTHER): Payer: 59 | Admitting: Cardiovascular Disease

## 2021-06-06 ENCOUNTER — Other Ambulatory Visit: Payer: Self-pay

## 2021-06-06 ENCOUNTER — Encounter: Payer: Self-pay | Admitting: Cardiovascular Disease

## 2021-06-06 VITALS — BP 143/78 | HR 97 | Ht 74.0 in | Wt 173.4 lb

## 2021-06-06 DIAGNOSIS — I300 Acute nonspecific idiopathic pericarditis: Secondary | ICD-10-CM | POA: Diagnosis not present

## 2021-06-06 DIAGNOSIS — Z720 Tobacco use: Secondary | ICD-10-CM

## 2021-06-06 MED ORDER — PANTOPRAZOLE SODIUM 40 MG PO TBEC: 40.0000 mg | DELAYED_RELEASE_TABLET | Freq: Every day | ORAL | 3 refills | Status: DC

## 2021-06-06 NOTE — Patient Instructions (Signed)
Medication Instructions:  °The current medical regimen is effective;  continue present plan and medications. ° °*If you need a refill on your cardiac medications before your next appointment, please call your pharmacy* ° ° °Lab Work: °CRP, ESR today  ° °If you have labs (blood work) drawn today and your tests are completely normal, you will receive your results only by: °MyChart Message (if you have MyChart) OR °A paper copy in the mail °If you have any lab test that is abnormal or we need to change your treatment, we will call you to review the results. ° °Follow-Up: °At [facility], you and your health needs are our priority.  As part of our continuing mission to provide you with exceptional heart care, we have created designated Provider Care Teams.  These Care Teams include your primary Cardiologist (physician) and Advanced Practice Providers (APPs -  Physician Assistants and Nurse Practitioners) who all work together to provide you with the care you need, when you need it. ° °We recommend signing up for the patient portal called "MyChart".  Sign up information is provided on this After Visit Summary.  MyChart is used to connect with patients for Virtual Visits (Telemedicine).  Patients are able to view lab/test results, encounter notes, upcoming appointments, etc.  Non-urgent messages can be sent to your provider as well.   °To learn more about what you can do with MyChart, go to [email or web address].   ° °Your next appointment:   °3 month(s) ° °The format for your next appointment:   °In Person ° °Provider:   °Wallace O'Neal, MD  ° ° °

## 2021-06-07 LAB — C-REACTIVE PROTEIN: CRP: 2 mg/L (ref 0–10)

## 2021-06-07 LAB — SEDIMENTATION RATE: Sed Rate: 28 mm/hr (ref 0–30)

## 2021-08-27 ENCOUNTER — Other Ambulatory Visit: Payer: Self-pay | Admitting: Cardiology

## 2021-08-30 NOTE — Progress Notes (Signed)
?Cardiology Office Note:   ?Date:  08/31/2021  ?NAME:  Mark Powell    ?MRN: [id number] ?DOB:  Oct 20, 1970  ? ?PCP:  Charlette Caffey, MD  ?Cardiologist:  None  ?Electrophysiologist:  None  ? ?Referring MD: Charlette Caffey, MD  ? ?Chief Complaint  ?Patient presents with  ? Follow-up  ?  3 months.  ? ? ?History of Present Illness:   ?Mark Powell is a 51 y.o. male with a hx of pericarditis who presents for follow-up.  Doing well.  On extended ibuprofen taper due to recurrent pericarditis.  She will complete this in the next few days.  On colchicine as well.  He will complete 6 months of colchicine on Oct 05, 2021.  He can stop at that time.  Again he will complete his ibuprofen.  No symptoms of chest pain.  His inflammatory markers have normalized.  I am hopeful he will have no further recurrence of pericarditis.  He denies any chest pain or trouble breathing.  Still smoking.  Needs to quit. ? ?Problem List ?Pericarditis ?-Initial Dx 09/2016 ?-recurrence 04/07/2021 ?-LHC 09/2016 -> normal ?2. HTN ?3. Tobacco abuse  ?  ? ?Past Medical History: ?Past Medical History:  ?Diagnosis Date  ? High cholesterol   ? Hypertension   ? Pericarditis   ? ? ?Past Surgical History: ?Past Surgical History:  ?Procedure Laterality Date  ? arm surgery    ? LEFT HEART CATH AND CORONARY ANGIOGRAPHY N/A 09/26/2016  ? Procedure: Left Heart Cath and Coronary Angiography;  Surgeon: Lennette Bihari, MD;  Location: [facility];  Service: Cardiovascular;  Laterality: N/A;  ? ? ?Current Medications: ?Current Meds  ?Medication Sig  ? colchicine 0.6 MG tablet Take 1 tablet (0.6 mg total) by mouth 2 (two) times daily. (Patient taking differently: Take 0.6 mg by mouth 2 (two) times daily.)  ? ibuprofen (ADVIL) 800 MG tablet Take 1 tablet (800 mg total) by mouth 3 (three) times daily. For 21 days-, then take 800 mg twice a day for 21 days, then 800 mg daily for 21 days.  ? losartan-hydrochlorothiazide (HYZAAR) 50-12.5 MG tablet Take 1  tablet by mouth daily.  ? pantoprazole (PROTONIX) 40 MG tablet Take 1 tablet (40 mg total) by mouth daily.  ?  ? ?Allergies:    ?Patient has no known allergies.  ? ?Social History: ?Social History  ? ?Socioeconomic History  ? Marital status: Single  ?  Spouse name: Not on file  ? Number of children: 0  ? Years of education: Not on file  ? Highest education level: Not on file  ?Occupational History  ? Occupation: Psychiatric nurse - [employer]  ?Tobacco Use  ? Smoking status: Every Day  ?  Packs/day: 0.25  ?  Years: 34.00  ?  Pack years: 8.50  ?  Types: Cigarettes  ? Smokeless tobacco: Never  ?Substance and Sexual Activity  ? Alcohol use: Yes  ?  Comment: daily  ? Drug use: Not Currently  ? Sexual activity: Not on file  ?Other Topics Concern  ? Not on file  ?Social History Narrative  ? Not on file  ? ?Social Determinants of Health  ? ?Financial Resource Strain: Not on file  ?Food Insecurity: Not on file  ?Transportation Needs: Not on file  ?Physical Activity: Not on file  ?Stress: Not on file  ?Social Connections: Not on file  ?  ? ?Family History: ?The patient's family history includes Heart attack (age of onset: 67) in his  brother. ? ?ROS:   ?All other ROS reviewed and negative. Pertinent positives noted in the HPI.    ? ?EKGs/Labs/Other Studies Reviewed:   ?The following studies were personally reviewed by me today: ? ? ?TTE 04/08/2021 ? 1. Left ventricular ejection fraction, by estimation, is 65 to 70%. The  ?left ventricle has normal function. The left ventricle has no regional  ?wall motion abnormalities. Left ventricular diastolic parameters were  ?normal.  ? 2. Right ventricular systolic function is normal. The right ventricular  ?size is normal. Tricuspid regurgitation signal is inadequate for assessing  ?PA pressure.  ? 3. Moderate pleural effusion.  ? 4. The mitral valve is normal in structure. No evidence of mitral valve  ?regurgitation.  ? 5. The aortic valve is normal in structure. Aortic valve regurgitation  is  ?not visualized. No aortic stenosis is present.  ? 6. Aortic midl ascending aortic aneurysm 3.5 cm.  ? 7. The inferior vena cava is normal in size with greater than 50%  ?respiratory variability, suggesting right atrial pressure of 3 mmHg.  ? ?Recent Labs: ?04/08/2021: BUN 13; Creatinine, Ser 0.67; Hemoglobin 12.1; Platelets 403; Potassium 3.5; Sodium 136  ? ?Recent Lipid Panel ?   ?Component Value Date/Time  ? CHOL 141 04/08/2021 0201  ? TRIG 81 04/08/2021 0201  ? HDL 33 (L) 04/08/2021 0201  ? CHOLHDL 4.3 04/08/2021 0201  ? VLDL 16 04/08/2021 0201  ? LDLCALC 92 04/08/2021 0201  ? ? ?Physical Exam:   ?VS:  BP 128/84 (BP Location: Left Arm, Patient Position: Sitting, Cuff Size: Normal)   Pulse 72   Ht 6\' 2"  (1.88 m)   Wt 167 lb (75.8 kg)   BMI 21.44 kg/m?    ?Wt Readings from Last 3 Encounters:  ?08/31/21 167 lb (75.8 kg)  ?06/06/21 173 lb 6.4 oz (78.7 kg)  ?04/19/21 178 lb 6.4 oz (80.9 kg)  ?  ?General: Well nourished, well developed, in no acute distress ?Head: Atraumatic, normal size  ?Eyes: PEERLA, EOMI  ?Neck: Supple, no JVD ?Endocrine: No thryomegaly ?Cardiac: Normal S1, S2; RRR; no murmurs, rubs, or gallops ?Lungs: Clear to auscultation bilaterally, no wheezing, rhonchi or rales  ?Abd: Soft, nontender, no hepatomegaly  ?Ext: No edema, pulses 2+ ?Musculoskeletal: No deformities, BUE and BLE strength normal and equal ?Skin: Warm and dry, no rashes   ?Neuro: Alert and oriented to person, place, time, and situation, CNII-XII grossly intact, no focal deficits  ?Psych: Normal mood and affect  ? ?ASSESSMENT:   ?Mark Powell is a 51 y.o. male who presents for the following: ?1. Recurrent idiopathic pericarditis   ?2. Tobacco abuse   ? ? ?PLAN:   ?1. Recurrent idiopathic pericarditis ?-History of recurrent pericarditis.  Diagnosed in November.  Placed on extended taper of ibuprofen.  Inflammatory markers have normalized.  Remains on colchicine.  He will complete his ibuprofen taper as well as Protonix.  He  can stop Protonix after he completes ibuprofen.  He will complete 6 months of colchicine which will stop on 10/05/2021.  He will then see me back afterwards.  Hopefully he does not have any recurrent episodes. ? ?2. Tobacco abuse ?-Still smoking.  3 minutes of smoking cessation counseling provided in office today. ? ?Disposition: Return in about 3 months (around 11/30/2021). ? ?Medication Adjustments/Labs and Tests Ordered: ?Current medicines are reviewed at length with the patient today.  Concerns regarding medicines are outlined above.  ?No orders of the defined types were placed in this encounter. ? ?No orders of  the defined types were placed in this encounter. ? ? ?Patient Instructions  ?Medication Instructions:  ?STOP Colchicine on Oct 05, 2021 ?*If you need a refill on your cardiac medications before your next appointment, please call your pharmacy* ? ?Follow-Up: ?At [facility], you and your health needs are our priority.  As part of our continuing mission to provide you with exceptional heart care, we have created designated Provider Care Teams.  These Care Teams include your primary Cardiologist (physician) and Advanced Practice Providers (APPs -  Physician Assistants and Nurse Practitioners) who all work together to provide you with the care you need, when you need it. ? ?We recommend signing up for the patient portal called "MyChart".  Sign up information is provided on this After Visit Summary.  MyChart is used to connect with patients for Virtual Visits (Telemedicine).  Patients are able to view lab/test results, encounter notes, upcoming appointments, etc.  Non-urgent messages can be sent to your provider as well.   ?To learn more about what you can do with MyChart, go to [email or web address].   ? ?Your next appointment:   ?3-4 month(s) ? ?The format for your next appointment:   ?In Person ? ?Provider:   ?Lennie Odor, MD  ? ?  ? ?Time Spent with Patient: I have spent a total of 25 minutes with  patient reviewing hospital notes, telemetry, EKGs, labs and examining the patient as well as establishing an assessment and plan that was discussed with the patient.  > 50% of time was spent in direct patient care.

## 2021-08-31 ENCOUNTER — Encounter: Payer: Self-pay | Admitting: Cardiovascular Disease

## 2021-08-31 ENCOUNTER — Ambulatory Visit (INDEPENDENT_AMBULATORY_CARE_PROVIDER_SITE_OTHER): Payer: 59 | Admitting: Cardiovascular Disease

## 2021-08-31 VITALS — BP 128/84 | HR 72 | Ht 74.0 in | Wt 167.0 lb

## 2021-08-31 DIAGNOSIS — Z720 Tobacco use: Secondary | ICD-10-CM | POA: Diagnosis not present

## 2021-08-31 DIAGNOSIS — I300 Acute nonspecific idiopathic pericarditis: Secondary | ICD-10-CM

## 2021-08-31 NOTE — Patient Instructions (Signed)
Medication Instructions:  ?STOP Colchicine on Oct 05, 2021 ?*If you need a refill on your cardiac medications before your next appointment, please call your pharmacy* ? ?Follow-Up: ?At [facility], you and your health needs are our priority.  As part of our continuing mission to provide you with exceptional heart care, we have created designated Provider Care Teams.  These Care Teams include your primary Cardiologist (physician) and Advanced Practice Providers (APPs -  Physician Assistants and Nurse Practitioners) who all work together to provide you with the care you need, when you need it. ? ?We recommend signing up for the patient portal called "MyChart".  Sign up information is provided on this After Visit Summary.  MyChart is used to connect with patients for Virtual Visits (Telemedicine).  Patients are able to view lab/test results, encounter notes, upcoming appointments, etc.  Non-urgent messages can be sent to your provider as well.   ?To learn more about what you can do with MyChart, go to [email or web address].   ? ?Your next appointment:   ?3-4 month(s) ? ?The format for your next appointment:   ?In Person ? ?Provider:   ?Lennie Odor, MD  ? ? ?

## 2022-01-07 NOTE — Progress Notes (Deleted)
Cardiology Office Note:   Date:  01/07/2022  NAME:  Ransom Nickson    MRN: [id number] DOB:  1970/06/12   PCP:  Charlette Caffey, MD  Cardiologist:  None  Electrophysiologist:  None   Referring MD: Charlette Caffey, MD   No chief complaint on file. ***  History of Present Illness:   Yitzchok Carriger is a 51 y.o. male with a hx of recurrent pericarditis, hypertension, tobacco abuse who presents for follow-up.  Problem List Pericarditis -Initial Dx 09/2016 -recurrence 04/07/2021 -LHC 09/2016 -> normal 2. HTN 3. Tobacco abuse   Past Medical History: Past Medical History:  Diagnosis Date   High cholesterol    Hypertension    Pericarditis     Past Surgical History: Past Surgical History:  Procedure Laterality Date   arm surgery     LEFT HEART CATH AND CORONARY ANGIOGRAPHY N/A 09/26/2016   Procedure: Left Heart Cath and Coronary Angiography;  Surgeon: Lennette Bihari, MD;  Location: [facility];  Service: Cardiovascular;  Laterality: N/A;    Current Medications: No outpatient medications have been marked as taking for the 01/11/22 encounter (Appointment) with O'Neal, Ronnald Ramp, MD.     Allergies:    Patient has no known allergies.   Social History: Social History   Socioeconomic History   Marital status: Single    Spouse name: Not on file   Number of children: 0   Years of education: Not on file   Highest education level: Not on file  Occupational History   Occupation: Psychiatric nurse - [employer]  Tobacco Use   Smoking status: Every Day    Packs/day: 0.25    Years: 34.00    Total pack years: 8.50    Types: Cigarettes   Smokeless tobacco: Never  Substance and Sexual Activity   Alcohol use: Yes    Comment: daily   Drug use: Not Currently   Sexual activity: Not on file  Other Topics Concern   Not on file  Social History Narrative   Not on file   Social Determinants of Health   Financial Resource Strain: Not on file  Food Insecurity: Not on  file  Transportation Needs: Not on file  Physical Activity: Not on file  Stress: Not on file  Social Connections: Not on file     Family History: The patient's ***family history includes Heart attack (age of onset: 44) in his brother.  ROS:   All other ROS reviewed and negative. Pertinent positives noted in the HPI.     EKGs/Labs/Other Studies Reviewed:   The following studies were personally reviewed by me today:  EKG:  EKG is *** ordered today.  The ekg ordered today demonstrates ***, and was personally reviewed by me.   TTE 04/08/2021   1. Left ventricular ejection fraction, by estimation, is 65 to 70%. The  left ventricle has normal function. The left ventricle has no regional  wall motion abnormalities. Left ventricular diastolic parameters were  normal.   2. Right ventricular systolic function is normal. The right ventricular  size is normal. Tricuspid regurgitation signal is inadequate for assessing  PA pressure.   3. Moderate pleural effusion.   4. The mitral valve is normal in structure. No evidence of mitral valve  regurgitation.   5. The aortic valve is normal in structure. Aortic valve regurgitation is  not visualized. No aortic stenosis is present.   6. Aortic midl ascending aortic aneurysm 3.5 cm.   7. The inferior vena cava  is normal in size with greater than 50%  respiratory variability, suggesting right atrial pressure of 3 mmHg.   Recent Labs: 04/08/2021: BUN 13; Creatinine, Ser 0.67; Hemoglobin 12.1; Platelets 403; Potassium 3.5; Sodium 136   Recent Lipid Panel    Component Value Date/Time   CHOL 141 04/08/2021 0201   TRIG 81 04/08/2021 0201   HDL 33 (L) 04/08/2021 0201   CHOLHDL 4.3 04/08/2021 0201   VLDL 16 04/08/2021 0201   LDLCALC 92 04/08/2021 0201    Physical Exam:   VS:  There were no vitals taken for this visit.   Wt Readings from Last 3 Encounters:  08/31/21 167 lb (75.8 kg)  06/06/21 173 lb 6.4 oz (78.7 kg)  04/19/21 178 lb 6.4 oz  (80.9 kg)    General: Well nourished, well developed, in no acute distress Head: Atraumatic, normal size  Eyes: PEERLA, EOMI  Neck: Supple, no JVD Endocrine: No thryomegaly Cardiac: Normal S1, S2; RRR; no murmurs, rubs, or gallops Lungs: Clear to auscultation bilaterally, no wheezing, rhonchi or rales  Abd: Soft, nontender, no hepatomegaly  Ext: No edema, pulses 2+ Musculoskeletal: No deformities, BUE and BLE strength normal and equal Skin: Warm and dry, no rashes   Neuro: Alert and oriented to person, place, time, and situation, CNII-XII grossly intact, no focal deficits  Psych: Normal mood and affect   ASSESSMENT:   Ziaire Hagos is a 51 y.o. male who presents for the following: No diagnosis found.  PLAN:   1. Recurrent idiopathic pericarditis ***  2. Tobacco abuse ***   {Are you ordering a CV Procedure (e.g. stress test, cath, DCCV, TEE, etc)?   Press F2        :[id number]}  Disposition: No follow-ups on file.  Medication Adjustments/Labs and Tests Ordered: Current medicines are reviewed at length with the patient today.  Concerns regarding medicines are outlined above.  No orders of the defined types were placed in this encounter.  No orders of the defined types were placed in this encounter.   There are no Patient Instructions on file for this visit.   Time Spent with Patient: I have spent a total of *** minutes with patient reviewing hospital notes, telemetry, EKGs, labs and examining the patient as well as establishing an assessment and plan that was discussed with the patient.  > 50% of time was spent in direct patient care.  Signed, Lenna Gilford. Flora Lipps, MD, [facility]  [facility]  [address] [place], [place] [address] [phone]  01/07/2022 2:04 PM

## 2022-01-11 ENCOUNTER — Ambulatory Visit: Payer: 59 | Admitting: Cardiovascular Disease

## 2022-01-11 DIAGNOSIS — I300 Acute nonspecific idiopathic pericarditis: Secondary | ICD-10-CM

## 2022-01-11 DIAGNOSIS — Z720 Tobacco use: Secondary | ICD-10-CM

## 2022-04-01 IMAGING — CR DG CHEST 2V
2 series · 2 of 2 positions shown · non-contrast
Comparison: 03/26/2021

CLINICAL DATA: Chest pain

EXAM:
CHEST - 2 VIEW

[w chest pa]
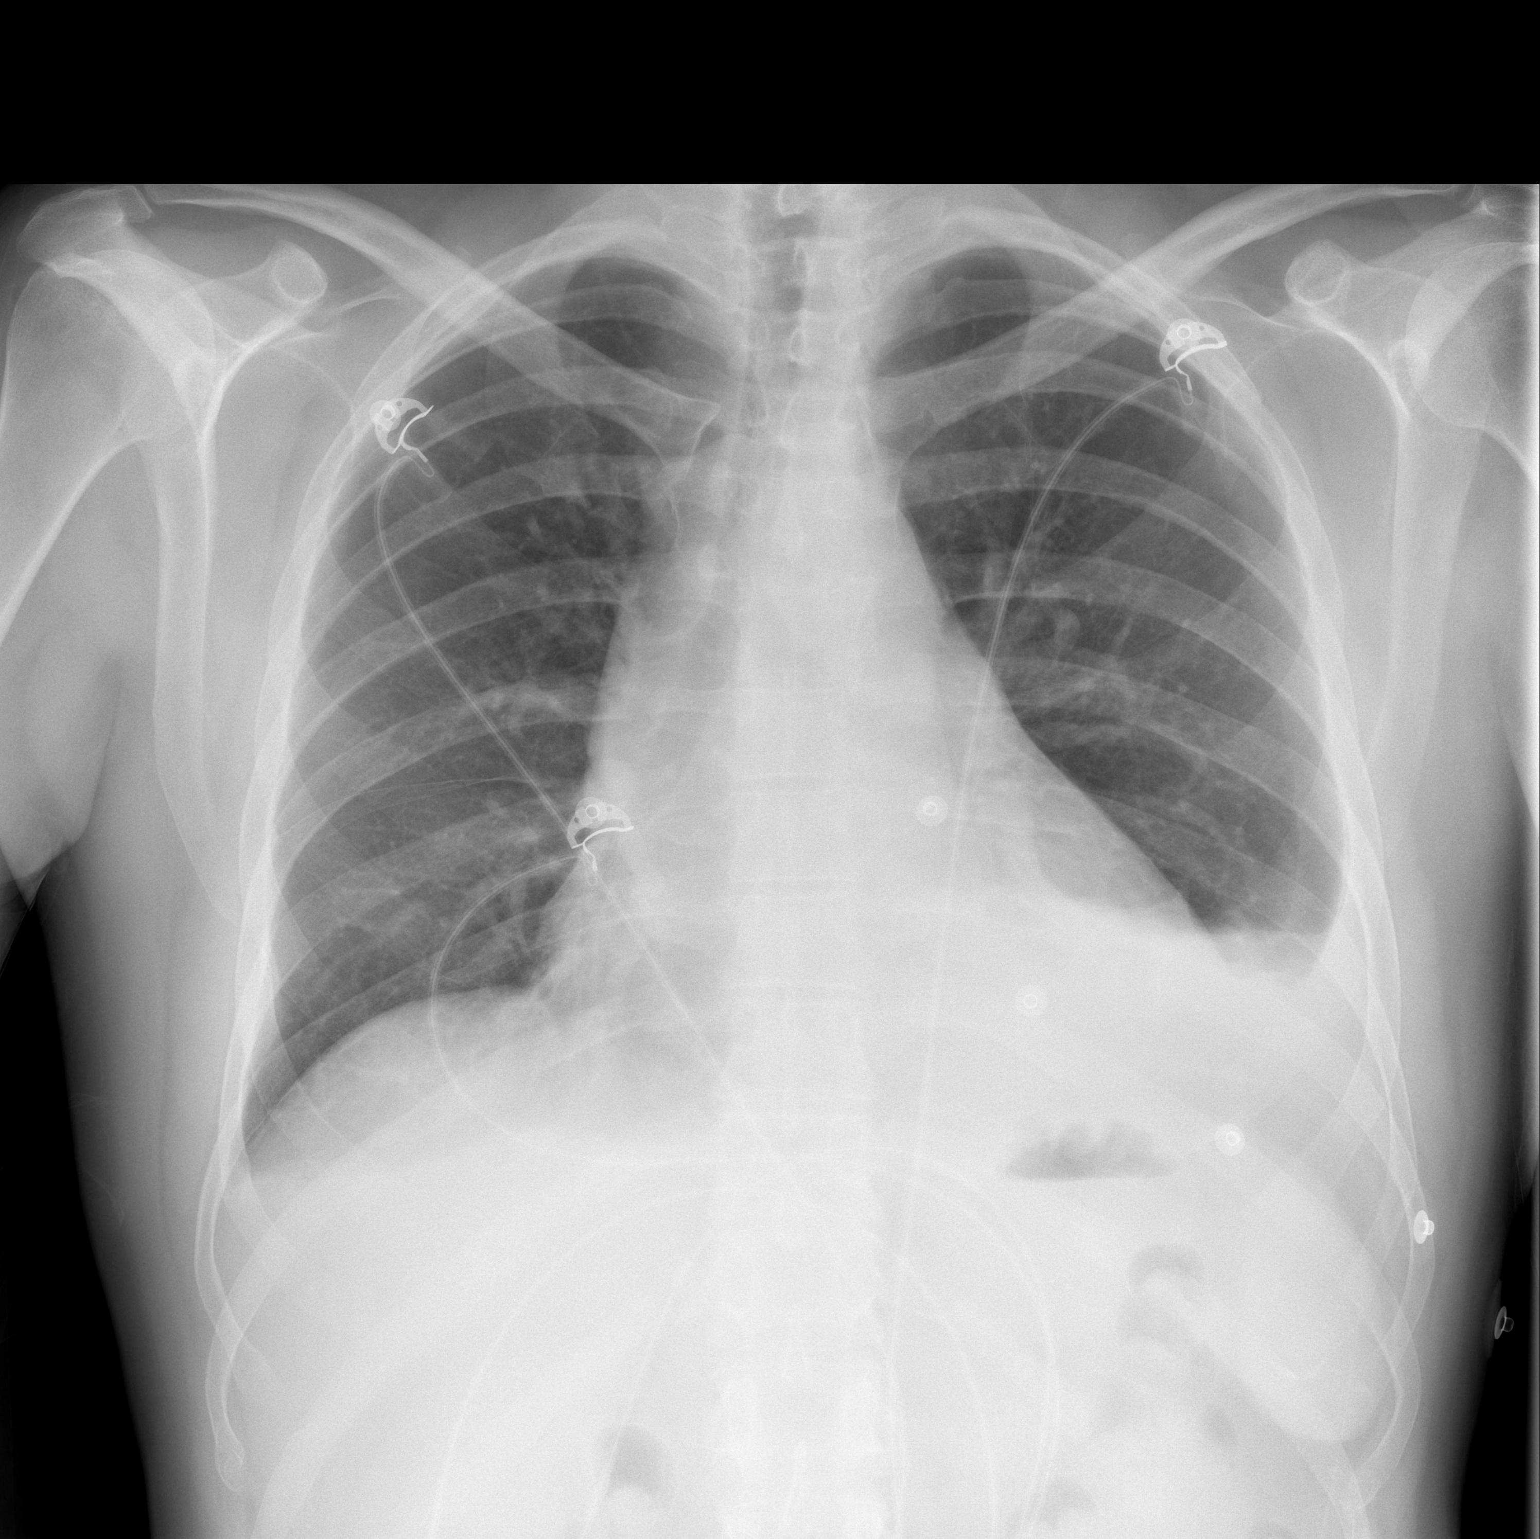

[w chest lat]
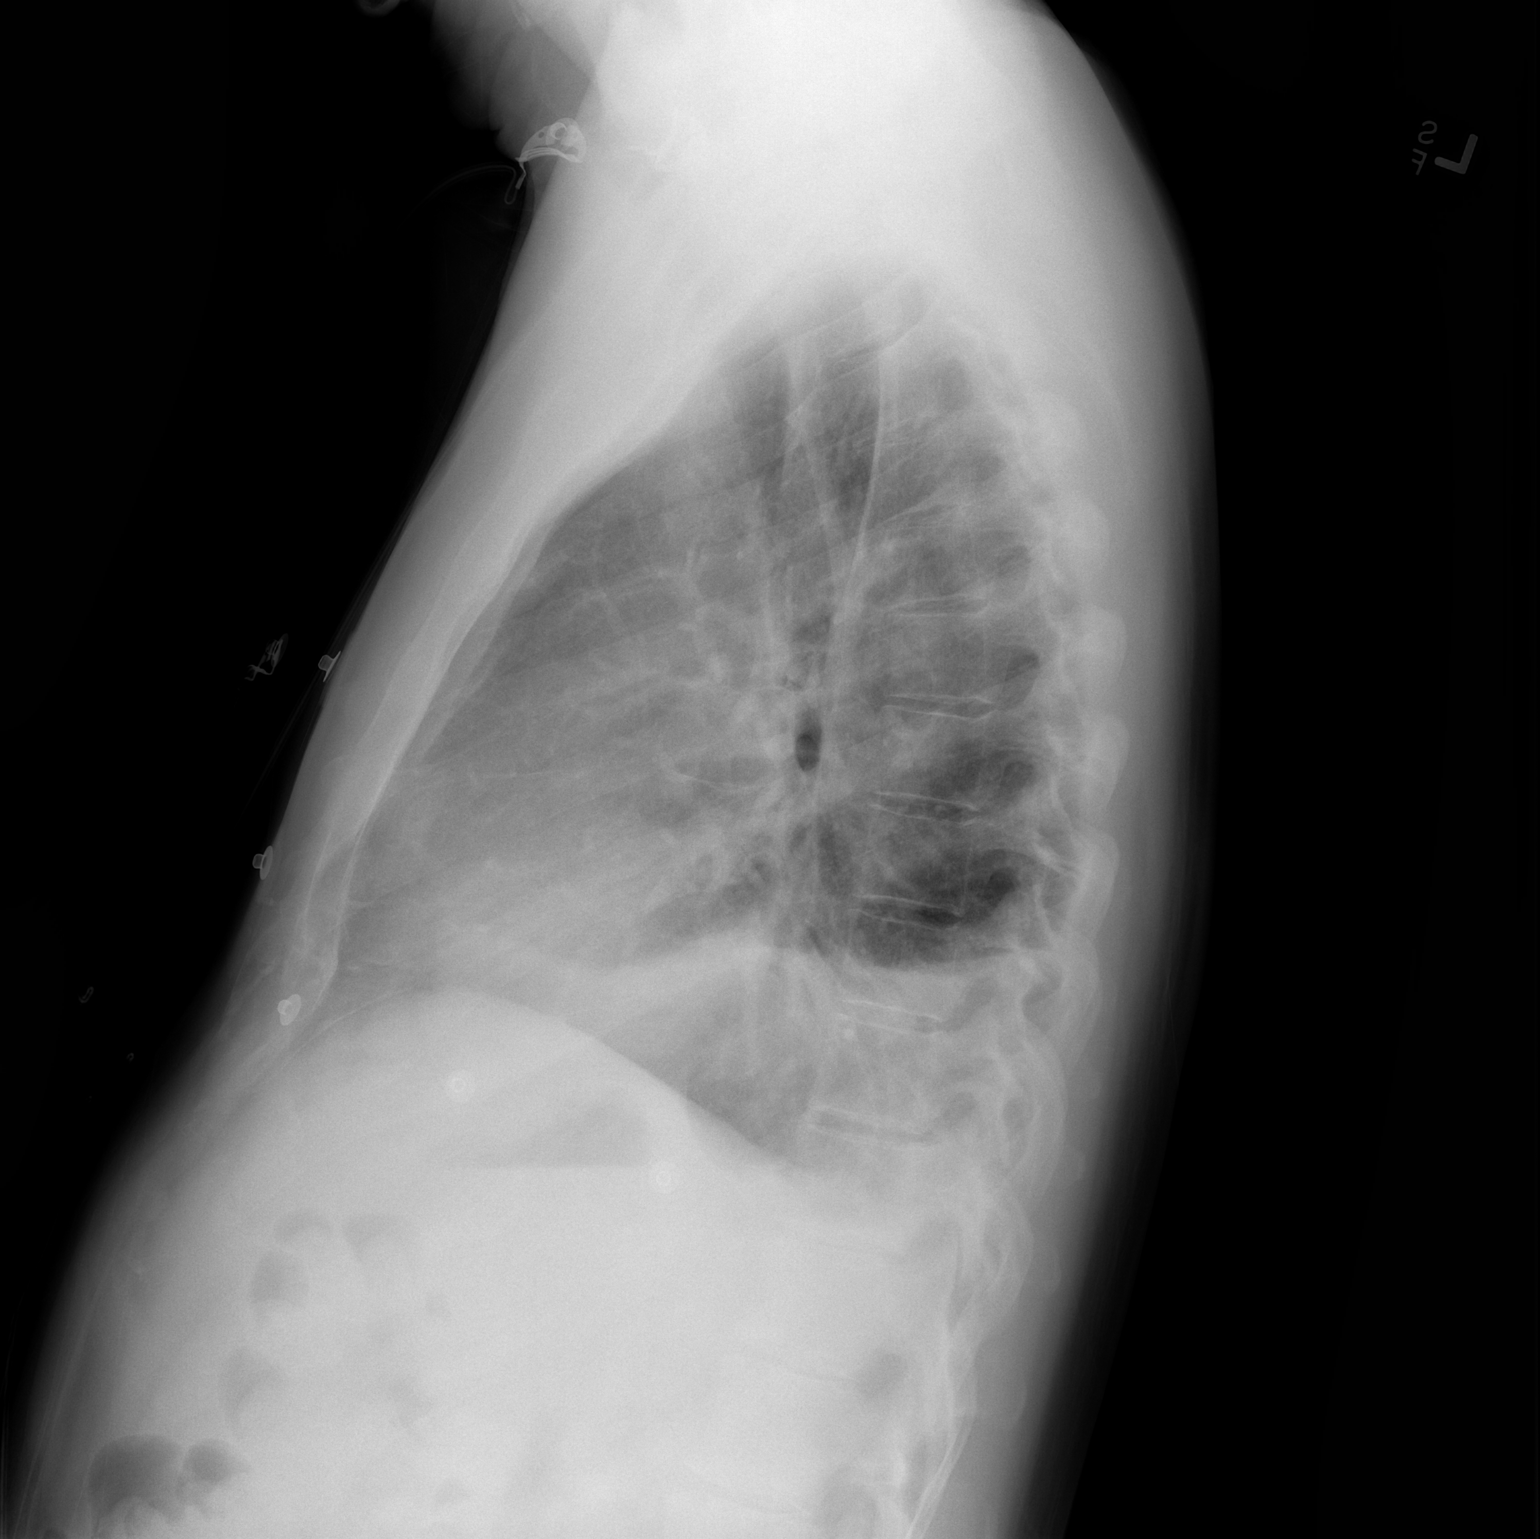

[2 of 2 positions shown; findings below may reference images not displayed]

FINDINGS: 3000 hours. Cardiopericardial silhouette is at upper limits of
normal for size. Small left pleural effusion is new in the interval.
Possible trace right pleural effusion. There is some atelectasis in
the lung bases. No pulmonary edema or pneumothorax. The visualized
bony structures of the thorax show no acute abnormality. Telemetry
leads overlie the chest.
IMPRESSION: Small left pleural effusion with bibasilar atelectasis.

Possible trace right effusion.

## 2023-01-20 LAB — CBC: RBC: 6.48 — AB (ref 3.87–5.11)

## 2023-01-20 LAB — HEPATIC FUNCTION PANEL
ALT: 46 U/L — AB (ref 10–40)
AST: 46 — AB (ref 14–40)
Alkaline Phosphatase: 40 (ref 25–125)
Bilirubin, Total: 1

## 2023-01-20 LAB — BASIC METABOLIC PANEL WITH GFR
BUN: 11 (ref 4–21)
CO2: 28 — AB (ref 13–22)
Chloride: 96 — AB (ref 99–108)
Creatinine: 1 (ref 0.6–1.3)
Glucose: 100
Potassium: 4.9 meq/L (ref 3.5–5.1)
Sodium: 140 (ref 137–147)

## 2023-01-20 LAB — VITAMIN D 25 HYDROXY (VIT D DEFICIENCY, FRACTURES): Vit D, 25-Hydroxy: 33.7

## 2023-01-20 LAB — IRON,TIBC AND FERRITIN PANEL
Ferritin: 466.7
Iron: 220
TIBC: 476
UIBC: 256

## 2023-01-20 LAB — CBC AND DIFFERENTIAL
HCT: 49 (ref 41–53)
Hemoglobin: 15.5 (ref 13.5–17.5)
Platelets: 283 10*3/uL (ref 150–400)
WBC: 6.3

## 2023-01-20 LAB — VITAMIN B12: Vitamin B-12: 312

## 2023-01-20 LAB — COMPREHENSIVE METABOLIC PANEL WITH GFR
Albumin: 5 (ref 3.5–5.0)
Calcium: 10.5 (ref 8.7–10.7)
Globulin: 3

## 2023-01-20 LAB — LIPID PANEL
Cholesterol: 247 — AB (ref 0–200)
HDL: 85 — AB (ref 35–70)
LDL Cholesterol: 148
Triglycerides: 72 (ref 40–160)

## 2023-01-22 DIAGNOSIS — E7800 Pure hypercholesterolemia, unspecified: Secondary | ICD-10-CM | POA: Diagnosis not present

## 2023-01-22 DIAGNOSIS — M129 Arthropathy, unspecified: Secondary | ICD-10-CM | POA: Diagnosis not present

## 2023-01-22 DIAGNOSIS — Z6822 Body mass index (BMI) 22.0-22.9, adult: Secondary | ICD-10-CM | POA: Diagnosis not present

## 2023-01-22 DIAGNOSIS — D539 Nutritional anemia, unspecified: Secondary | ICD-10-CM | POA: Diagnosis not present

## 2023-01-22 DIAGNOSIS — E559 Vitamin D deficiency, unspecified: Secondary | ICD-10-CM | POA: Diagnosis not present

## 2023-01-22 DIAGNOSIS — I10 Essential (primary) hypertension: Secondary | ICD-10-CM | POA: Diagnosis not present

## 2023-01-22 LAB — LAB REPORT - SCANNED
Calcium: 10.5
EGFR: 78
PTH: 27.8

## 2023-02-15 ENCOUNTER — Telehealth: Payer: Self-pay | Admitting: Cardiovascular Disease

## 2023-02-15 DIAGNOSIS — I300 Acute nonspecific idiopathic pericarditis: Secondary | ICD-10-CM

## 2023-02-15 NOTE — Telephone Encounter (Signed)
   Pt c/o of Chest Pain: STAT if active CP, including tightness, pressure, jaw pain, radiating pain to shoulder/upper arm/back, CP unrelieved by Nitro. Symptoms reported of SOB, nausea, vomiting, sweating.  1. Are you having CP right now? Chest pain an shiortness of breath all morning    2. Are you experiencing any other symptoms (ex. SOB, nausea, vomiting, sweating)? no   3. Is your CP continuous or coming and going? Coming and going   4. Have you taken Nitroglycerin? np   5. How long have you been experiencing CP? Yesterday, worse today    6. If NO CP at time of call then end call with telling Pt to call back or call 911 if Chest pain returns prior to return call from triage team.

## 2023-02-15 NOTE — Telephone Encounter (Signed)
Patient came in clinic because a scheduling error with appointment. He states he can not come back tomorrow for an appointment   He states his pericarditis is causing him discomfort. He also experienced SOB. Last appointment 08/31/21. Tried to offer other appointment options but he states he just need medication to control the symptoms before they get worse. He states he is fine at the moment but he doesn't want it to flare up.

## 2023-02-15 NOTE — Telephone Encounter (Signed)
Patient states he has pericarditis and has been experiencing slight chest pain and SOB. He states he was informed to call the office instead of going to the ED. Appointment made for today with DOD.

## 2023-02-16 ENCOUNTER — Ambulatory Visit: Payer: 59 | Admitting: Cardiovascular Disease

## 2023-02-16 NOTE — Telephone Encounter (Signed)
Left voicemail to return call to office.

## 2023-02-19 NOTE — Telephone Encounter (Signed)
Left voicemail to return call to office

## 2023-02-22 MED ORDER — COLCHICINE 0.6 MG PO TABS: 0.6000 mg | ORAL_TABLET | Freq: Two times a day (BID) | ORAL | 2 refills | Status: DC

## 2023-02-22 MED ORDER — IBUPROFEN 800 MG PO TABS: ORAL_TABLET | ORAL | 0 refills | Status: DC

## 2023-02-22 NOTE — Telephone Encounter (Signed)
Can we put him on ibuprofen 800 mg TID x 7 days and colchicine 0.6 mg BID. He needs to come in for labs including ESR and CRP. Have him see me next time I am in the office and overbook ok.  Gerri Spore T. Flora Lipps, MD, [facility]  [facility] [address] [place], [place] [address] [phone] 1:18 PM

## 2023-02-22 NOTE — Telephone Encounter (Signed)
RN called patient. Informed patient calling to follow up  last week  encounter. Patient states he had some medication from previous episodes and start taking medication.  RN informed patient per Dr Flora Lipps stated take Ibuprofen 800 mg tid for 7 days and colchicine 0.6 mg  twice a day   Have ESR And CRP completed and appointment scheduled  Patient voiced understanding - request medication be sent to pharmacy  appointment schedule for Oct 16 , at 9:20 am   Patient [place] going out of the country on [place] 18 - [place].

## 2023-02-22 NOTE — Addendum Note (Signed)
Addended by: Tobin Chad on: 02/22/2023 11:35 AM   Modules accepted: Orders

## 2023-02-23 DIAGNOSIS — I300 Acute nonspecific idiopathic pericarditis: Secondary | ICD-10-CM | POA: Diagnosis not present

## 2023-02-23 NOTE — Telephone Encounter (Signed)
Lab tech came to triage stating that pt is here for lab that he was told to come in for. Per Dr Marylene Buerger note from previous episodes included in this note:   RN informed patient per Dr Flora Lipps stated take Ibuprofen 800 mg tid for 7 days and colchicine 0.6 mg  twice a day   Have ESR And CRP completed and appointment scheduled. I have entered requested labwork and printed lab order for pt to have done now. Given to lab tech.

## 2023-02-23 NOTE — Addendum Note (Signed)
Addended by: Alyson Ingles on: 02/23/2023 04:11 PM   Modules accepted: Orders

## 2023-02-24 LAB — C-REACTIVE PROTEIN: CRP: <1 mg/L (ref 0–10)

## 2023-02-24 LAB — SEDIMENTATION RATE: Sed Rate: 12 mm/h (ref 0–30)

## 2023-03-13 NOTE — Progress Notes (Unsigned)
Cardiology Office Note:  .   Date:  03/14/2023  ID:  Mark Powell, DOB 25-Sep-1970, MRN [id number] PCP: Charlette Caffey, MD  [facility] Providers Cardiologist:  None    History of Present Illness: .   Mark Powell is a 52 y.o. male with history of acute pericarditis who presents for follow-up.   Discussed the use of AI scribe software for clinical note transcription with the patient, who gave verbal consent to proceed.  History of Present Illness   Mr. Mark Powell, a patient with a history of recurrent pericarditis, presents with a recent flare of symptoms. He reports experiencing shortness of breath and chest tightness, which worsens when lying back and improves when sitting forward. These symptoms began in September and were similar to his previous episodes of pericarditis. He self-initiated treatment with leftover colchicine and ibuprofen (800mg  daily) prescribed by his doctor, which he has been taking for approximately two weeks. His symptoms have since resolved.  Interestingly, Mr. Mark Powell tested positive for COVID-19 two weeks prior to the onset of his pericarditis symptoms. His COVID-19 symptoms were mild and lasted for about four days. He has since tested negative multiple times.  Mr. Mark Powell also reports that his blood pressure has been running high today, which is unusual for him as he has been consistently taking losartan and HCTZ and his blood pressure is usually in the 120 range. He is a current smoker, consuming about a pack every three to four days. He works for [employer].          Problem List Pericarditis -Initial Dx 09/2016 -recurrence 04/07/2021 and 01/2023 -LHC 09/2016 -> normal 2. HTN 3. Tobacco abuse     ROS: All other ROS reviewed and negative. Pertinent positives noted in the HPI.     Studies Reviewed: Marland Kitchen   EKG Interpretation Date/Time:  Wednesday March 14 2023 09:29:47 EDT Ventricular Rate:  72 PR Interval:  152 QRS Duration:  86 QT  Interval:  410 QTC Calculation: 448 R Axis:   13  Text Interpretation: Normal sinus rhythm Normal ECG Confirmed by Lennie Odor [phone]) on 03/14/2023 9:39:56 AM   Physical Exam:   VS:  BP (!) 150/98 (BP Location: Right Arm, Patient Position: Sitting, Cuff Size: Normal)   Pulse 72   Ht 6\' 2"  (1.88 m)   Wt 178 lb 9.6 oz (81 kg)   SpO2 99%   BMI 22.93 kg/m    Wt Readings from Last 3 Encounters:  03/14/23 178 lb 9.6 oz (81 kg)  08/31/21 167 lb (75.8 kg)  06/06/21 173 lb 6.4 oz (78.7 kg)    GEN: Well nourished, well developed in no acute distress NECK: No JVD; No carotid bruits CARDIAC: RRR, no murmurs, rubs, gallops RESPIRATORY:  Clear to auscultation without rales, wheezing or rhonchi  ABDOMEN: Soft, non-tender, non-distended EXTREMITIES:  No edema; No deformity  ASSESSMENT AND PLAN: .   Assessment and Plan    Recurrent Pericarditis Recent episode of chest tightness, worse with laying back, improved with ibuprofen and colchicine. Recent history of COVID-19 infection. CRP negative. -Complete current course of ibuprofen and colchicine. -Consider Rilonacept (Arcalyst) for prevention of future episodes. Pharmacy to provide patient education. -Schedule repeat echocardiogram. -Follow-up in 3 months.  Hypertension Blood pressure elevated in office (150/98), but patient reports normal readings at home. Currently on Losartan and HCTZ. -Monitor blood pressure at home. -Continue current antihypertensive regimen.  Tobacco Use Current smoker, approximately 1 pack every 3-4 days. -Strongly advised to quit smoking. -3 min  smoking cessation counseling provided              Follow-up: Return in about 3 months (around 06/14/2023).  Time Spent with Patient: I have spent a total of 35 minutes with patient reviewing hospital notes, telemetry, EKGs, labs and examining the patient as well as establishing an assessment and plan that was discussed with the patient.  > 50% of time was spent in  direct patient care.  Signed, Lenna Gilford. Flora Lipps, MD, [facility]  [facility]  [address] [place], [place] [address] [phone]  9:50 AM

## 2023-03-14 ENCOUNTER — Other Ambulatory Visit (HOSPITAL_COMMUNITY): Payer: Self-pay

## 2023-03-14 ENCOUNTER — Encounter: Payer: Self-pay | Admitting: Cardiovascular Disease

## 2023-03-14 ENCOUNTER — Ambulatory Visit: Payer: BC Managed Care – PPO | Attending: Cardiovascular Disease | Admitting: Cardiovascular Disease

## 2023-03-14 ENCOUNTER — Telehealth: Payer: Self-pay | Admitting: Pharmacist

## 2023-03-14 VITALS — BP 150/98 | HR 72 | Ht 74.0 in | Wt 178.6 lb

## 2023-03-14 DIAGNOSIS — I300 Acute nonspecific idiopathic pericarditis: Secondary | ICD-10-CM | POA: Diagnosis not present

## 2023-03-14 DIAGNOSIS — F17210 Nicotine dependence, cigarettes, uncomplicated: Secondary | ICD-10-CM | POA: Diagnosis not present

## 2023-03-14 DIAGNOSIS — I150 Renovascular hypertension: Secondary | ICD-10-CM

## 2023-03-14 DIAGNOSIS — Z720 Tobacco use: Secondary | ICD-10-CM

## 2023-03-14 NOTE — Patient Instructions (Addendum)
Medication Instructions:  Your physician recommends that you continue on your current medications as directed. Please refer to the Current Medication list given to you today.  *If you need a refill on your cardiac medications before your next appointment, please call your pharmacy*   Lab Work: None   Testing/Procedures: Your physician has requested that you have an echocardiogram. Echocardiography is a painless test that uses sound waves to create images of your heart. It provides your doctor with information about the size and shape of your heart and how well your heart's chambers and valves are working. This procedure takes approximately one hour. There are no restrictions for this procedure. Please do NOT wear cologne, perfume, aftershave, or lotions (deodorant is allowed). Please arrive 15 minutes prior to your appointment time.    Follow-Up: At [facility], you and your health needs are our priority.  As part of our continuing mission to provide you with exceptional heart care, we have created designated Provider Care Teams.  These Care Teams include your primary Cardiologist (physician) and Advanced Practice Providers (APPs -  Physician Assistants and Nurse Practitioners) who all work together to provide you with the care you need, when you need it.  Your next appointment:   3 month(s)  Provider:   Reatha Harps, MD     Other instructions: Please complete your Ibuprofen and Colchicine regimen.

## 2023-03-14 NOTE — Telephone Encounter (Signed)
Completed enrollment form for Arcalyst and faxed to [facility]

## 2023-03-15 ENCOUNTER — Telehealth: Payer: Self-pay | Admitting: Pharmacy Technician

## 2023-03-15 ENCOUNTER — Telehealth: Payer: Self-pay | Admitting: Pharmacist

## 2023-03-15 ENCOUNTER — Telehealth: Payer: Self-pay | Admitting: Cardiovascular Disease

## 2023-03-15 ENCOUNTER — Other Ambulatory Visit: Payer: Self-pay | Admitting: Pharmacist

## 2023-03-15 ENCOUNTER — Other Ambulatory Visit: Payer: Self-pay

## 2023-03-15 DIAGNOSIS — I300 Acute nonspecific idiopathic pericarditis: Secondary | ICD-10-CM

## 2023-03-15 NOTE — Telephone Encounter (Signed)
Follow Up:ge:   Reference#   [id number]           Please call, concerning prior authorization for patient's Arcalyst.

## 2023-03-15 NOTE — Telephone Encounter (Signed)
Pharmacy Patient Advocate Encounter  Received notification from [facility] that Prior Authorization for arcalyst has been DENIED.  See denial reason below. No denial letter attached in CMM. Will attache denial letter to Media tab once received.   PA #/Case ID/Reference #: [id number]

## 2023-03-15 NOTE — Telephone Encounter (Signed)
Contacted patient regarding health plan's requirement to have a TB test. Patient voiced understanding. Is going out of the country next week to get married and will have lab drawn when he returns.

## 2023-03-15 NOTE — Telephone Encounter (Signed)
Pharmacy Patient Advocate Encounter   Received notification from RX Request Messages that prior authorization for arcalyst is required/requested.   Insurance verification completed.   The patient is insured through [employer] .   Per test claim: PA required; PA submitted to [employer] via CoverMyMeds Key/confirmation #/EOC [id number] Status is pending

## 2023-03-26 DIAGNOSIS — I300 Acute nonspecific idiopathic pericarditis: Secondary | ICD-10-CM | POA: Diagnosis not present

## 2023-03-26 DIAGNOSIS — Z111 Encounter for screening for respiratory tuberculosis: Secondary | ICD-10-CM | POA: Diagnosis not present

## 2023-03-28 LAB — QUANTIFERON-TB GOLD PLUS
QuantiFERON Mitogen Value: >10 [IU]/mL
QuantiFERON Nil Value: 0 [IU]/mL
QuantiFERON TB1 Ag Value: 0 [IU]/mL
QuantiFERON TB2 Ag Value: 0 [IU]/mL
QuantiFERON-TB Gold Plus: NEGATIVE

## 2023-04-02 ENCOUNTER — Other Ambulatory Visit (HOSPITAL_COMMUNITY): Payer: Self-pay

## 2023-04-02 ENCOUNTER — Telehealth (HOSPITAL_BASED_OUTPATIENT_CLINIC_OR_DEPARTMENT_OTHER): Payer: Self-pay | Admitting: Pharmacy Technician

## 2023-04-02 ENCOUNTER — Ambulatory Visit: Payer: 59 | Admitting: Cardiovascular Disease

## 2023-04-02 NOTE — Telephone Encounter (Signed)
Pharmacy Patient Advocate Encounter  Received notification from [facility] that Prior Authorization for arcalyst has been APPROVED from 04/02/23 to 04/30/23   PA #/Case ID/Reference #: [id number]

## 2023-04-02 NOTE — Telephone Encounter (Signed)
-----   Message from Nurse Stark Bray sent at 03/30/2023  5:27 PM EDT ----- Called patient. Patient made aware of the lab results. Verbalized understanding. No questions or concerns expressed at this time.  Patient would like to start rilanocept but said prior auth was held up by TB test.

## 2023-04-04 NOTE — Telephone Encounter (Signed)
PA for Arclayst approved. Contacted [employer] and gave approval reference number. Kirby Funk will work with [facility] mail order and facilitate delivery and training for patient.

## 2023-04-04 NOTE — Telephone Encounter (Signed)
Contacted [employer] to go over next steps for aquiring Arcalyst. LMOM for [employer]

## 2023-04-09 ENCOUNTER — Ambulatory Visit (HOSPITAL_BASED_OUTPATIENT_CLINIC_OR_DEPARTMENT_OTHER): Payer: BC Managed Care – PPO

## 2023-04-10 DIAGNOSIS — L989 Disorder of the skin and subcutaneous tissue, unspecified: Secondary | ICD-10-CM | POA: Diagnosis not present

## 2023-06-10 NOTE — Progress Notes (Signed)
  Cardiology Office Note:  .   Date:  06/12/2023  ID:  Fairy Daniels, DOB [birth year], MRN [id number] PCP: Felisa Reece SQUIBB, MD  [facility] Providers Cardiologist:  Darryle ONEIDA Decent, MD { History of Present Illness: .   Kanen Mottola is a 53 y.o. male with history of recurrent pericarditis who presents for follow-up.   History of Present Illness   Mr. Carole, a 53 year old male with a history of recurrent pericarditis, presents for follow-up. He has been on Rilonacept (Arcalyst) for nine weeks, administered as a weekly injection. Initially apprehensive about the treatment, he now reports feeling good and has noticed a significant difference in his symptoms. He has not experienced any further chest pain, a common symptom of his condition.  In addition to his pericarditis, Mr. Carole has hypertension, which is managed with Losartan . However, his blood pressure has been slightly elevated in recent readings, despite regular medication use. He has not been monitoring his blood pressure at home recently but agrees to start doing so every few days.  Mr. Carole also reports that he has successfully quit smoking since the beginning of the year. He is due for a skin biopsy, which he is concerned about due to the potential infection risk associated with his Arcalyst treatment. However, he has been reassured that the risk is very low.          Problem List Pericarditis -Initial Dx 09/2016 -recurrence 04/07/2021 and 01/2023 -LHC 09/2016 -> normal 2. HTN 3. Tobacco abuse  -stopped 05/30/2023    ROS: All other ROS reviewed and negative. Pertinent positives noted in the HPI.     Studies Reviewed: SABRA       Physical Exam:   VS:  BP (!) 146/90 (BP Location: Left Arm, Patient Position: Sitting, Cuff Size: Normal)   Pulse 78   Ht 6' 2 (1.88 m)   Wt 188 lb (85.3 kg)   BMI 24.14 kg/m    Wt Readings from Last 3 Encounters:  06/12/23 188 lb (85.3 kg)  03/14/23 178 lb 9.6 oz (81 kg)   08/31/21 167 lb (75.8 kg)    GEN: Well nourished, well developed in no acute distress NECK: No JVD; No carotid bruits CARDIAC: RRR, no murmurs, rubs, gallops RESPIRATORY:  Clear to auscultation without rales, wheezing or rhonchi  ABDOMEN: Soft, non-tender, non-distended EXTREMITIES:  No edema; No deformity  ASSESSMENT AND PLAN: .   Assessment and Plan    Recurrent Pericarditis On Rilonacept (Arcalyst) for 9 weeks with good response and no recurrence of symptoms. Discussed the duration of therapy, likely 1-2 years. -Continue Rilonacept weekly injections. -Follow-up in 6 months.  Hypertension Blood pressure slightly elevated at today's visit despite regular use of Losartan . -Check blood pressure at home every few days. -If persistently elevated, consider increasing Losartan  dose.  Tobacco Use Quit smoking as of May 30, 2023. -Congratulated on smoking cessation. -Encouraged to maintain abstinence.  General Health Maintenance -Continue annual cholesterol checks with [facility] doctors. -Plan for skin biopsy, low risk of infection with Rilonacept.              Follow-up: Return in about 6 months (around 12/10/2023).  Signed, Darryle ONEIDA. Decent, MD, [facility]  [facility]  [address] [place], [place] [address] [phone]  9:22 AM

## 2023-06-11 DIAGNOSIS — L989 Disorder of the skin and subcutaneous tissue, unspecified: Secondary | ICD-10-CM | POA: Diagnosis not present

## 2023-06-12 ENCOUNTER — Encounter: Payer: Self-pay | Admitting: Cardiovascular Disease

## 2023-06-12 ENCOUNTER — Ambulatory Visit: Payer: BC Managed Care – PPO | Attending: Cardiovascular Disease | Admitting: Cardiovascular Disease

## 2023-06-12 VITALS — BP 146/90 | HR 78 | Ht 74.0 in | Wt 188.0 lb

## 2023-06-12 DIAGNOSIS — I300 Acute nonspecific idiopathic pericarditis: Secondary | ICD-10-CM | POA: Diagnosis not present

## 2023-06-12 DIAGNOSIS — Z720 Tobacco use: Secondary | ICD-10-CM

## 2023-06-12 DIAGNOSIS — I150 Renovascular hypertension: Secondary | ICD-10-CM | POA: Diagnosis not present

## 2023-06-12 NOTE — Patient Instructions (Signed)
 Medication Instructions:  Your physician recommends that you continue on your current medications as directed. Please refer to the Current Medication list given to you today.    *If you need a refill on your cardiac medications before your next appointment, please call your pharmacy*   Lab Work: NONE    If you have labs (blood work) drawn today and your tests are completely normal, you will receive your results only by: MyChart Message (if you have MyChart) OR A paper copy in the mail If you have any lab test that is abnormal or we need to change your treatment, we will call you to review the results.   Testing/Procedures: NONE    Follow-Up: At [facility], you and your health needs are our priority.  As part of our continuing mission to provide you with exceptional heart care, we have created designated Provider Care Teams.  These Care Teams include your primary Cardiologist (physician) and Advanced Practice Providers (APPs -  Physician Assistants and Nurse Practitioners) who all work together to provide you with the care you need, when you need it.  We recommend signing up for the patient portal called MyChart.  Sign up information is provided on this After Visit Summary.  MyChart is used to connect with patients for Virtual Visits (Telemedicine).  Patients are able to view lab/test results, encounter notes, upcoming appointments, etc.  Non-urgent messages can be sent to your provider as well.   To learn more about what you can do with MyChart, go to [email or web address].    Your next appointment:   6 month(s)  The format for your next appointment:   In Person  Provider:   Darryle ONEIDA Decent, MD    Other Instructions Dr. Decent recommends you check your  blood pressure every other day and keep a log of the readings.

## 2023-06-13 ENCOUNTER — Emergency Department (HOSPITAL_BASED_OUTPATIENT_CLINIC_OR_DEPARTMENT_OTHER): Payer: BC Managed Care – PPO

## 2023-06-13 ENCOUNTER — Emergency Department (HOSPITAL_BASED_OUTPATIENT_CLINIC_OR_DEPARTMENT_OTHER)
Admission: EM | Admit: 2023-06-13 | Discharge: 2023-06-13 | Disposition: A | Discharge status: Home / Self Care | Payer: BC Managed Care – PPO | Attending: Emergency Medicine | Admitting: Emergency Medicine

## 2023-06-13 ENCOUNTER — Encounter (HOSPITAL_BASED_OUTPATIENT_CLINIC_OR_DEPARTMENT_OTHER): Payer: Self-pay | Admitting: Emergency Medicine

## 2023-06-13 ENCOUNTER — Other Ambulatory Visit: Payer: Self-pay

## 2023-06-13 DIAGNOSIS — I10 Essential (primary) hypertension: Secondary | ICD-10-CM | POA: Diagnosis not present

## 2023-06-13 DIAGNOSIS — W228XXA Striking against or struck by other objects, initial encounter: Secondary | ICD-10-CM | POA: Diagnosis not present

## 2023-06-13 DIAGNOSIS — Z79899 Other long term (current) drug therapy: Secondary | ICD-10-CM | POA: Diagnosis not present

## 2023-06-13 DIAGNOSIS — S90121A Contusion of right lesser toe(s) without damage to nail, initial encounter: Secondary | ICD-10-CM

## 2023-06-13 DIAGNOSIS — S99921A Unspecified injury of right foot, initial encounter: Secondary | ICD-10-CM | POA: Diagnosis not present

## 2023-06-13 MED ORDER — ACETAMINOPHEN 325 MG PO TABS
650.0000 mg | ORAL_TABLET | Freq: Once | ORAL | Status: AC
Start: 2023-06-13 — End: 2023-06-13
  Administered 2023-06-13: 650 mg via ORAL
  Filled 2023-06-13: qty 2

## 2023-06-13 NOTE — ED Notes (Signed)
 Reviewed discharge instructions with pt. States understanding. Pt ambulatory at discharge

## 2023-06-13 NOTE — ED Triage Notes (Signed)
 Pt c/o pain and discoloration to RT middle toe after kicking the coffee table during a dream Friday night

## 2023-06-13 NOTE — Discharge Instructions (Signed)
 As discussed, your imaging is reassuring.  There is no fracture or dislocation.  Alternate between ibuprofen  and Tylenol  every 4 hours as needed for pain.  You can ice the toe for additional relief.  Get help right away if: You have very bad pain. Your foot or toes are numb. Your foot or toes turn very light (pale) or cold. You cannot move your foot or ankle.

## 2023-06-13 NOTE — ED Provider Notes (Signed)
 [facility] Provider Note   CSN: [id number] Arrival date & time: 06/13/23  1444     History  Chief Complaint  Patient presents with   Toe Injury    Mark Powell is a 53 y.o. male with a history of hypertension hyperlipidemia who presents the ED today after a toe injury.  Patient reports that 6 days ago he fell asleep on the couch and kicked the coffee table in his sleep.  He woke up with pain to the right middle toe.  Since then he has developed bruising to the tip of the right middle toe and has pain with ambulation.  He states that he has not taken anything for the pain and is just concerned about the discoloration.  He denies any pain to the rest of the toes, foot, or ankle.  No additional complaints or concerns at this time.    Home Medications Prior to Admission medications   Medication Sig Start Date End Date Taking? Authorizing Provider  ARCALYST 220 MG injection Inject into the skin. 05/29/23   [provider]  losartan -hydrochlorothiazide  (HYZAAR) 50-12.5 MG tablet Take 1 tablet by mouth daily.    [provider]      Allergies    Patient has no known allergies.    Review of Systems   Review of Systems  Skin:  Positive for color change.  All other systems reviewed and are negative.   Physical Exam Updated Vital Signs BP (!) 139/96   Pulse 87   Temp 97.9 F (36.6 C)   Resp 18   Ht 6\' 2"  (1.88 m)   Wt 85.3 kg   SpO2 97%   BMI 24.14 kg/m  Physical Exam Vitals and nursing note reviewed.  Constitutional:      General: He is not in acute distress.    Appearance: Normal appearance.  HENT:     Head: Normocephalic and atraumatic.     Mouth/Throat:     Mouth: Mucous membranes are moist.  Eyes:     Conjunctiva/sclera: Conjunctivae normal.     Pupils: Pupils are equal, round, and reactive to light.  Cardiovascular:     Rate and Rhythm: Normal rate and regular rhythm.     Pulses: Normal pulses.   Pulmonary:     Effort: Pulmonary effort is normal.     Breath sounds: Normal breath sounds.  Abdominal:     Palpations: Abdomen is soft.     Tenderness: There is no abdominal tenderness.  Musculoskeletal:        General: Tenderness present.     Comments: Tenderness to palpation of the DIP of the right middle toe with ecchymosis.  There is no skin tears present.  Capillary refills within 2 seconds. Range of motion, strength, and sensation intact.  No tenderness to palpation of the rest of his toes, foot, or ankle.  Skin:    General: Skin is warm and dry.     Findings: No rash.  Neurological:     General: No focal deficit present.     Mental Status: He is alert.     Sensory: No sensory deficit.     Motor: No weakness.  Psychiatric:        Mood and Affect: Mood normal.        Behavior: Behavior normal.     ED Results / Procedures / Treatments   Labs (all labs ordered are listed, but only abnormal results are displayed) Labs Reviewed - No  data to display  EKG None  Radiology DG Toe 3rd Right Result Date: 06/13/2023 CLINICAL DATA:  Injury.  Third toe redness and swelling. EXAM: RIGHT THIRD TOE COMPARISON:  None Available. FINDINGS: There is no evidence of fracture or dislocation. There is no evidence of arthropathy or other focal bone abnormality. Mild soft tissue edema. No radiopaque foreign body or soft tissue gas. IMPRESSION: Mild soft tissue edema. No fracture or subluxation of the third toe. Electronically Signed   By: Chadwick Colonel M.D.   On: 06/13/2023 16:49    Procedures Procedures: not indicated.   Medications Ordered in ED Medications  acetaminophen  (TYLENOL ) tablet 650 mg (650 mg Oral Given 06/13/23 1617)    ED Course/ Medical Decision Making/ A&P                                 Medical Decision Making Amount and/or Complexity of Data Reviewed Radiology: ordered.  Risk OTC drugs.   This patient presents to the ED for concern of right middle toe  injury, this involves an extensive number of treatment options, and is a complaint that carries with it a high risk of complications and morbidity.   Differential diagnosis includes: fracture, dislocation, contusion, hematoma, abrasion, laceration, etc.   Comorbidities  See HPI above   Additional History  Additional history obtained from prior records.   Imaging Studies  I ordered imaging studies including right 3rd toe x-ray  I independently visualized and interpreted imaging which showed: Mild soft tissue edema.  No fracture or subluxation of the third toe. I agree with the radiologist interpretation   Problem List / ED Course / Critical Interventions / Medication Management  Right middle toe bruising x6 days I ordered medications including: Tylenol  for pain  Reevaluation of the patient after these medicines showed that the patient improved I have reviewed the patients home medicines and have made adjustments as needed   Social Determinants of Health  Housing    Test / Admission - Considered  Discussed findings with patient.  All questions were answered. He is stable and safe for discharge home. Return precautions provided.       Final Clinical Impression(s) / ED Diagnoses Final diagnoses:  Contusion of middle toe, right, initial encounter    Rx / DC Orders ED Discharge Orders     None         Sonnie Dusky, PA-C 06/13/23 1656    Sueellen Emery, MD 06/13/23 1705

## 2023-07-09 ENCOUNTER — Encounter: Payer: Self-pay | Admitting: Cardiovascular Disease

## 2023-07-10 NOTE — Progress Notes (Unsigned)
Cardiology Office Note:  .   Date:  07/11/2023  ID:  Mark Powell, DOB 1970-10-11, MRN [id number] PCP: Charlette Caffey, MD  [facility] Providers Cardiologist:  Reatha Harps, MD { History of Present Illness: .    Chief Complaint  Patient presents with   Follow-up         Mark Powell is a 53 y.o. male with history of pericarditis who presents for follow-up. Started on arcalyst with rash side effect reported.    History of Present Illness   Mark Powell is a 53 year old male with pericarditis who presents for follow-up.  He started Arcalyst for pericarditis and has not experienced any recurrence of chest pain or symptoms since. He is not on any other medication for pericarditis, but has paused Arcalyst due to a rash.  The rash, characterized by intensely itchy, tiny bumps, appeared on his thighs, buttocks, arms, underbelly, and sides shortly after beginning Arcalyst in January. His last injection was on February 2nd. The rash is not localized to the injection sites, as he alternates them weekly. There have been no new detergents or changes in personal care products that could explain the rash.  He reports elevated blood pressure today, having been off his blood pressure medication, losartan HCTZ, for two days due to needing a refill. Occasional smoking may contribute to his elevated blood pressure. He has not been monitoring his blood pressure at home but plans to resume his medication and check his blood pressure regularly.       Feb 2nd      Problem List Pericarditis -Initial Dx 09/2016 -recurrence 04/07/2021 and 01/2023 -LHC 09/2016 -> normal 2. HTN 3. Tobacco abuse  -stopped 05/30/2023    ROS: All other ROS reviewed and negative. Pertinent positives noted in the HPI.     Studies Reviewed: Marland Kitchen        TTE 04/08/2021  1. Left ventricular ejection fraction, by estimation, is 65 to 70%. The  left ventricle has normal function. The left ventricle has  no regional  wall motion abnormalities. Left ventricular diastolic parameters were  normal.   2. Right ventricular systolic function is normal. The right ventricular  size is normal. Tricuspid regurgitation signal is inadequate for assessing  PA pressure.   3. Moderate pleural effusion.   4. The mitral valve is normal in structure. No evidence of mitral valve  regurgitation.   5. The aortic valve is normal in structure. Aortic valve regurgitation is  not visualized. No aortic stenosis is present.   6. Aortic midl ascending aortic aneurysm 3.5 cm.   7. The inferior vena cava is normal in size with greater than 50%  respiratory variability, suggesting right atrial pressure of 3 mmHg.  Physical Exam:   VS:  BP (!) 158/88 (BP Location: Left Arm, Patient Position: Sitting, Cuff Size: Normal)   Pulse 76   Ht 6\' 2"  (1.88 m)   Wt 191 lb 12.8 oz (87 kg)   SpO2 98%   BMI 24.63 kg/m    Wt Readings from Last 3 Encounters:  07/11/23 191 lb 12.8 oz (87 kg)  06/13/23 188 lb (85.3 kg)  06/12/23 188 lb (85.3 kg)    GEN: Well nourished, well developed in no acute distress NECK: No JVD; No carotid bruits CARDIAC: RRR, no murmurs, rubs, gallops RESPIRATORY:  Clear to auscultation without rales, wheezing or rhonchi  ABDOMEN: Soft, non-tender, non-distended EXTREMITIES:  No edema; No deformity  ASSESSMENT AND PLAN: .   Assessment  and Plan    Rash Medication side effect  New onset, itchy rash on thighs, buttocks, and arms after starting Arcalyst. Unclear if this is a drug eruption. -Hold Arcalyst until dermatology evaluation. -If rash resolves off Arcalyst, likely drug eruption and will not restart medication. -If dermatologist does not believe rash is related to Arcalyst, may restart medication.  Pericarditis, recurrent  No current symptoms of pericarditis. If unable to continue Arcalyst due to potential drug eruption, may need to consider alternative treatments such as prednisone. -Continue  monitoring for recurrence of pericarditis symptoms.  Hypertension Elevated blood pressure today, patient has been out of losartan/HCTZ. -Refill losartan/HCTZ. -Patient to monitor blood pressure at home.  Follow-up in 6 months or sooner if pericarditis symptoms recur or rash does not resolve.              Follow-up: Return in about 6 months (around 01/08/2024).   Signed, Lenna Gilford. Flora Lipps, MD, [facility]  [facility]  [address] [place], [place] [address] [phone]  3:25 PM

## 2023-07-11 ENCOUNTER — Ambulatory Visit: Payer: BC Managed Care – PPO | Attending: Cardiovascular Disease | Admitting: Cardiovascular Disease

## 2023-07-11 ENCOUNTER — Encounter: Payer: Self-pay | Admitting: Cardiovascular Disease

## 2023-07-11 VITALS — BP 158/88 | HR 76 | Ht 74.0 in | Wt 191.8 lb

## 2023-07-11 DIAGNOSIS — I300 Acute nonspecific idiopathic pericarditis: Secondary | ICD-10-CM

## 2023-07-11 DIAGNOSIS — T887XXD Unspecified adverse effect of drug or medicament, subsequent encounter: Secondary | ICD-10-CM | POA: Diagnosis not present

## 2023-07-11 DIAGNOSIS — T887XXA Unspecified adverse effect of drug or medicament, initial encounter: Secondary | ICD-10-CM

## 2023-07-11 DIAGNOSIS — I150 Renovascular hypertension: Secondary | ICD-10-CM | POA: Diagnosis not present

## 2023-07-11 NOTE — Patient Instructions (Signed)
Medication Instructions:  HOLD: Arcalyst until see by dermatology  *If you need a refill on your cardiac medications before your next appointment, please call your pharmacy*   Follow-Up: At [facility], you and your health needs are our priority.  As part of our continuing mission to provide you with exceptional heart care, we have created designated Provider Care Teams.  These Care Teams include your primary Cardiologist (physician) and Advanced Practice Providers (APPs -  Physician Assistants and Nurse Practitioners) who all work together to provide you with the care you need, when you need it.   Your next appointment:   6 month(s)  Provider:   Reatha Harps, MD

## 2023-08-16 DIAGNOSIS — L270 Generalized skin eruption due to drugs and medicaments taken internally: Secondary | ICD-10-CM | POA: Diagnosis not present

## 2023-08-16 DIAGNOSIS — L989 Disorder of the skin and subcutaneous tissue, unspecified: Secondary | ICD-10-CM | POA: Diagnosis not present

## 2023-08-21 DIAGNOSIS — H524 Presbyopia: Secondary | ICD-10-CM | POA: Diagnosis not present

## 2023-08-21 DIAGNOSIS — Z961 Presence of intraocular lens: Secondary | ICD-10-CM | POA: Diagnosis not present

## 2023-08-21 DIAGNOSIS — H26493 Other secondary cataract, bilateral: Secondary | ICD-10-CM | POA: Diagnosis not present

## 2023-08-23 DIAGNOSIS — D239 Other benign neoplasm of skin, unspecified: Secondary | ICD-10-CM | POA: Diagnosis not present

## 2023-08-23 DIAGNOSIS — L989 Disorder of the skin and subcutaneous tissue, unspecified: Secondary | ICD-10-CM | POA: Diagnosis not present

## 2023-11-14 ENCOUNTER — Encounter (HOSPITAL_BASED_OUTPATIENT_CLINIC_OR_DEPARTMENT_OTHER): Payer: Self-pay | Admitting: Emergency Medicine

## 2023-11-14 ENCOUNTER — Ambulatory Visit
Admission: EM | Admit: 2023-11-14 | Discharge: 2023-11-14 | Disposition: A | Discharge status: Home / Self Care | Attending: Emergency Medicine | Admitting: Emergency Medicine

## 2023-11-14 ENCOUNTER — Emergency Department (HOSPITAL_BASED_OUTPATIENT_CLINIC_OR_DEPARTMENT_OTHER)
Admission: EM | Admit: 2023-11-14 | Discharge: 2023-11-14 | Disposition: A | Discharge status: Home / Self Care | Attending: Emergency Medicine | Admitting: Emergency Medicine

## 2023-11-14 ENCOUNTER — Other Ambulatory Visit: Payer: Self-pay

## 2023-11-14 DIAGNOSIS — R000 Tachycardia, unspecified: Secondary | ICD-10-CM | POA: Insufficient documentation

## 2023-11-14 DIAGNOSIS — R220 Localized swelling, mass and lump, head: Secondary | ICD-10-CM

## 2023-11-14 DIAGNOSIS — T782XXA Anaphylactic shock, unspecified, initial encounter: Secondary | ICD-10-CM | POA: Diagnosis not present

## 2023-11-14 DIAGNOSIS — T7840XA Allergy, unspecified, initial encounter: Secondary | ICD-10-CM | POA: Diagnosis not present

## 2023-11-14 DIAGNOSIS — Z79899 Other long term (current) drug therapy: Secondary | ICD-10-CM | POA: Diagnosis not present

## 2023-11-14 DIAGNOSIS — K130 Diseases of lips: Secondary | ICD-10-CM | POA: Diagnosis not present

## 2023-11-14 DIAGNOSIS — R21 Rash and other nonspecific skin eruption: Secondary | ICD-10-CM | POA: Diagnosis not present

## 2023-11-14 DIAGNOSIS — I10 Essential (primary) hypertension: Secondary | ICD-10-CM | POA: Insufficient documentation

## 2023-11-14 MED ORDER — DIPHENHYDRAMINE HCL 50 MG/ML IJ SOLN
50.0000 mg | Freq: Once | INTRAMUSCULAR | Status: AC
  Administered 2023-11-14: 50 mg via INTRAVENOUS
  Filled 2023-11-14: qty 1

## 2023-11-14 MED ORDER — HYDROXYZINE HCL 25 MG PO TABS: 25.0000 mg | ORAL_TABLET | Freq: Four times a day (QID) | ORAL | 0 refills | Status: DC

## 2023-11-14 MED ORDER — FAMOTIDINE IN NACL 20-0.9 MG/50ML-% IV SOLN
20.0000 mg | Freq: Once | INTRAVENOUS | Status: AC
  Administered 2023-11-14: 20 mg via INTRAVENOUS
  Filled 2023-11-14: qty 50

## 2023-11-14 MED ORDER — EPINEPHRINE 0.3 MG/0.3ML IJ SOAJ: 0.3000 mg | INTRAMUSCULAR | 0 refills | Status: DC | PRN

## 2023-11-14 MED ORDER — LACTATED RINGERS IV BOLUS
1000.0000 mL | Freq: Once | INTRAVENOUS | Status: AC
  Administered 2023-11-14: 1000 mL via INTRAVENOUS

## 2023-11-14 MED ORDER — METHYLPREDNISOLONE SODIUM SUCC 125 MG IJ SOLR
125.0000 mg | INTRAMUSCULAR | Status: AC
  Administered 2023-11-14: 125 mg via INTRAVENOUS
  Filled 2023-11-14: qty 2

## 2023-11-14 MED ORDER — EPINEPHRINE 0.3 MG/0.3ML IJ SOAJ
0.3000 mg | Freq: Once | INTRAMUSCULAR | Status: AC
  Administered 2023-11-14: 0.3 mg via INTRAMUSCULAR

## 2023-11-14 MED ORDER — PREDNISONE 10 MG (21) PO TBPK: ORAL_TABLET | Freq: Every day | ORAL | 0 refills | Status: DC

## 2023-11-14 MED ORDER — DEXAMETHASONE SODIUM PHOSPHATE 10 MG/ML IJ SOLN
10.0000 mg | Freq: Once | INTRAMUSCULAR | Status: AC
  Administered 2023-11-14: 10 mg via INTRAMUSCULAR

## 2023-11-14 NOTE — ED Provider Notes (Signed)
 [facility]    CSN: [id number] Arrival date & time: 11/14/23  1218      History   Chief Complaint Chief Complaint  Patient presents with   Allergic Reaction   Oral Swelling    HPI Mark Powell is a 53 y.o. male.   Patient presents today with lip swelling, rash to face neck and abdomen area after eating a musketeer  and a Krackle chocolate candy approximately 2 hours ago.  Patient states that he has never had problems with eating chocolate in the past he was eating [employer] cups last night with no problems.  Denies any chest pain no shortness of breath.  Has not started any new medications.  Has not had any any new soaps or lotions.    Past Medical History:  Diagnosis Date   High cholesterol    Hypertension    Pericarditis     Patient Active Problem List   Diagnosis Date Noted   Pericarditis 04/07/2021   Hypokalemia 10/12/2016   Acute pericarditis 09/27/2016   Chest pain 09/26/2016   HLD (hyperlipidemia) 09/26/2016   HTN (hypertension) 09/26/2016   Chest pain with high risk for cardiac etiology     Past Surgical History:  Procedure Laterality Date   arm surgery     LEFT HEART CATH AND CORONARY ANGIOGRAPHY N/A 09/26/2016   Procedure: Left Heart Cath and Coronary Angiography;  Surgeon: Millicent Ally, MD;  Location: [facility];  Service: Cardiovascular;  Laterality: N/A;       Home Medications    Prior to Admission medications   Medication Sig Start Date End Date Taking? Authorizing Provider  EPINEPHrine 0.3 mg/0.3 mL IJ SOAJ injection Inject 0.3 mg into the muscle as needed for anaphylaxis. 11/14/23  Yes Harden Leyden, NP  hydrOXYzine (ATARAX) 25 MG tablet Take 1 tablet (25 mg total) by mouth every 6 (six) hours. 11/14/23  Yes Harden Leyden, NP  predniSONE (STERAPRED UNI-PAK 21 TAB) 10 MG (21) TBPK tablet Take by mouth daily. Take 6 tabs by mouth daily  for 2 days, then 5 tabs for 2 days, then 4 tabs for 2 days, then 3 tabs for 2  days, 2 tabs for 2 days, then 1 tab by mouth daily for 2 days 11/14/23  Yes Brenita Callow, Margaree Sandhu L, NP  ARCALYST 220 MG injection Inject into the skin. 05/29/23   [provider]  losartan -hydrochlorothiazide  (HYZAAR) 50-12.5 MG tablet Take 1 tablet by mouth daily.    [provider]  triamcinolone cream (KENALOG) 0.1 % Apply 1 Application topically 2 (two) times daily as needed. 07/04/23   [provider]    Family History Family History  Problem Relation Age of Onset   Heart attack Brother 86    Social History Social History   Tobacco Use   Smoking status: Some Days    Current packs/day: 0.25    Average packs/day: 0.3 packs/day for 34.0 years (8.5 ttl pk-yrs)    Types: Cigarettes   Smokeless tobacco: Never   Tobacco comments:    07/11/2023 Patient smokes occasionally  Substance Use Topics   Alcohol use: Yes    Comment: daily   Drug use: Not Currently     Allergies   Patient has no known allergies.   Review of Systems Review of Systems  Constitutional:  Negative for fever.  HENT:  Positive for facial swelling.        Moderate amount of bilateral upper and lower lip swelling  Respiratory:  Negative for cough, shortness of breath, wheezing and stridor.   Cardiovascular: Negative.   Gastrointestinal: Negative.   Skin:  Positive for rash.       Red flat rash noted to face around mouth neck abdomen  Neurological: Negative.      Physical Exam Triage Vital Signs ED Triage Vitals [11/14/23 1225]  Encounter Vitals Group     BP (!) 138/93     Girls Systolic BP Percentile      Girls Diastolic BP Percentile      Boys Systolic BP Percentile      Boys Diastolic BP Percentile      Pulse Rate 85     Resp 16     Temp 98.3 F (36.8 C)     Temp Source Oral     SpO2 96 %     Weight      Height      Head Circumference      Peak Flow      Pain Score 0     Pain Loc      Pain Education      Exclude from Growth Chart    No data found.  Updated  Vital Signs BP (!) 138/93 (BP Location: Left Arm)   Pulse 85   Temp 98.3 F (36.8 C) (Oral)   Resp 16   SpO2 96%   Visual Acuity Right Eye Distance:   Left Eye Distance:   Bilateral Distance:    Right Eye Near:   Left Eye Near:    Bilateral Near:     Physical Exam Constitutional:      Appearance: Normal appearance.  HENT:     Nose: Nose normal.     Mouth/Throat:     Mouth: Mucous membranes are moist.     Pharynx: Oropharynx is clear.     Comments: Bilateral upper and lower lip small amount of edema noted  Eyes:     Pupils: Pupils are equal, round, and reactive to light.    Cardiovascular:     Rate and Rhythm: Normal rate.  Pulmonary:     Effort: Pulmonary effort is normal.     Breath sounds: No wheezing or rhonchi.  Abdominal:     General: Abdomen is flat.   Musculoskeletal:     Cervical back: Normal range of motion.   Skin:    Findings: Rash present.     Comments: Red flat rash noted around mouth cheeks neck back of the head on abdomen   Neurological:     Mental Status: He is alert.      UC Treatments / Results  Labs (all labs ordered are listed, but only abnormal results are displayed) Labs Reviewed - No data to display  EKG   Radiology No results found.  Procedures Procedures (including critical care time)  Medications Ordered in UC Medications  dexamethasone (DECADRON) injection 10 mg (10 mg Intramuscular Given 11/14/23 1315)    Initial Impression / Assessment and Plan / UC Course  I have reviewed the triage vital signs and the nursing notes.  Pertinent labs & imaging results that were available during my care of the patient were reviewed by me and considered in my medical decision making (see chart for details).     Discussed with patient beforehand to the allergy specialist he will need to be cautious with eating certain foods such as chocolate or candies Concerns for new onset of food allergies Ordering an EpiPen and how to use  this Patient currently  denies any shortness of breath or distress expressed if he does become short of breath he will need to seen in the ER or call an ambulance transport Take anti-itch medicine as needed do not take this medication with Benadryl take 1 every other Patient also states that he has been taking his blood pressure medicine regularly for years no change in dosage and has never had any facial swelling with taking that medicine. Patient is feeling better after injection denies any shortness of breath or distress upon discharge   Final Clinical Impressions(s) / UC Diagnoses   Final diagnoses:  Allergic reaction, initial encounter  Rash and nonspecific skin eruption  Swelling of both lips     Discharge Instructions      Discussed with patient beforehand to the allergy specialist he will need to be cautious with eating certain foods such as chocolate or candies Concerns for new onset of food allergies Ordering an EpiPen and how to use this Patient currently denies any shortness of breath or distress expressed if he does become short of breath he will need to seen in the ER or call an ambulance transport Take anti-itch medicine as needed do not take this medication with Benadryl take 1 every other      ED Prescriptions     Medication Sig Dispense Auth. Provider   predniSONE (STERAPRED UNI-PAK 21 TAB) 10 MG (21) TBPK tablet Take by mouth daily. Take 6 tabs by mouth daily  for 2 days, then 5 tabs for 2 days, then 4 tabs for 2 days, then 3 tabs for 2 days, 2 tabs for 2 days, then 1 tab by mouth daily for 2 days 42 tablet Horacio Lyell L, NP   EPINEPHrine 0.3 mg/0.3 mL IJ SOAJ injection Inject 0.3 mg into the muscle as needed for anaphylaxis. 1 each Harden Leyden, NP   hydrOXYzine (ATARAX) 25 MG tablet Take 1 tablet (25 mg total) by mouth every 6 (six) hours. 12 tablet Harden Leyden, NP      PDMP not reviewed this encounter.   Harden Leyden,  NP 11/14/23 1330

## 2023-11-14 NOTE — Discharge Instructions (Addendum)
 You were seen in the ER for evaluation after your allergic reaction. I am glad that you are feeling better. Please avoid the foods that you had today incase of an additional reaction. I am referring you to an allergist that you need to follow up with. They should call you, however if they do not reach out in the next few days, please call them. Make sure that you keep your epi pens on you at all times. I recommend taking a daily Claritin as well. There is a chance for rebound anaphylaxis in the next week. If you start to have any symptoms like you did today, inject yourself and call 911. I have included more information in the chart for you to review. If you have any concerns, new or worsening symptoms, please return to the nearest ER for re-evaluation.   Contact a doctor if: Your auto-injector pen is expired. Get help right away if: You have a bad allergic reaction. You use your auto-injector pen. You must go to the hospital even if the medicine seems to be working. These symptoms may be an emergency. Use the auto-injector pen right away. Then call 911. Do not wait to see if the symptoms will go away. Do not drive yourself to the hospital.

## 2023-11-14 NOTE — ED Triage Notes (Signed)
 Allergic reaction , swollen throat and lips this morning , was seen at [facility] and received decadron .  No obvious resp distress , yet hoarse voice  Reports tick bite 1 month ago .  Ate a chocolate bar this morning 1015 am , 15 minutes later started having allergic reaction symptoms .  Epi-pen administered during triage

## 2023-11-14 NOTE — ED Provider Notes (Signed)
 [place] EMERGENCY DEPARTMENT AT [facility] [place] Provider Note   CSN: [id number] Arrival date & time: 11/14/23  1702     Patient presents with: Allergic Reaction   Mark Powell is a 53 y.o. male with h/o HTN on losartan  presents to the ER today for evaluation of allergic reaction. The patient reports that he had had a crackle bar and another piece of chocolate and around 15 minutes later found that his face/lips were swollen and he had a rash. He went to [facility] and was given decadron , sent in prescriptions, and sent home. He reports that he went home and started to feel the symptoms again and his wife saw him and brought him into the ER. He reports his throat feels scratchy, but denies SOB or chest pain. He reports that he has never had an allergic reaction before and has not had any new medications or products.    Allergic Reaction      Prior to Admission medications   Medication Sig Start Date End Date Taking? Authorizing Provider  ARCALYST 220 MG injection Inject into the skin. 05/29/23   [provider]  EPINEPHrine  0.3 mg/0.3 mL IJ SOAJ injection Inject 0.3 mg into the muscle as needed for anaphylaxis. 11/14/23   Merilee Andrea CROME, NP  hydrOXYzine (ATARAX) 25 MG tablet Take 1 tablet (25 mg total) by mouth every 6 (six) hours. 11/14/23   Merilee Andrea CROME, NP  losartan -hydrochlorothiazide  (HYZAAR) 50-12.5 MG tablet Take 1 tablet by mouth daily.    [provider]  predniSONE (STERAPRED UNI-PAK 21 TAB) 10 MG (21) TBPK tablet Take by mouth daily. Take 6 tabs by mouth daily  for 2 days, then 5 tabs for 2 days, then 4 tabs for 2 days, then 3 tabs for 2 days, 2 tabs for 2 days, then 1 tab by mouth daily for 2 days 11/14/23   Merilee Andrea CROME, NP  triamcinolone cream (KENALOG) 0.1 % Apply 1 Application topically 2 (two) times daily as needed. 07/04/23   [provider]    Allergies: Arcalyst [rilonacept]    Review of Systems  HENT:  Positive for  facial swelling and sore throat.   Respiratory:  Negative for cough and shortness of breath.   Cardiovascular:  Negative for chest pain.  Gastrointestinal:  Negative for nausea and vomiting.  Neurological:  Negative for syncope.    Updated Vital Signs BP (!) 162/107   Pulse 87   Temp 98.2 F (36.8 C) (Oral)   Resp 16   Wt 79.4 kg   SpO2 98%   BMI 22.47 kg/m   Physical Exam Vitals and nursing note reviewed.  Constitutional:      Appearance: He is not toxic-appearing.  HENT:     Head:     Comments: Some mild facial swelling noted     Mouth/Throat:     Mouth: Mucous membranes are moist.     Comments: Upper and lower lip swelling. Controlling secretions. Airway patent   Eyes:     General: No scleral icterus.   Cardiovascular:     Rate and Rhythm: Tachycardia present.  Pulmonary:     Effort: Pulmonary effort is normal. No respiratory distress.     Breath sounds: No stridor.     Comments: No stridor. CTAB. No resp distress. Abdominal:     Tenderness: There is no abdominal tenderness.   Skin:    General: Skin is warm and dry.     Comments: Diffuse urticaria rash noted to the  chest, abdomen, and back.    Neurological:     Mental Status: He is alert.     (all labs ordered are listed, but only abnormal results are displayed) Labs Reviewed - No data to display  EKG: EKG Interpretation Date/Time:  Wednesday November 14 2023 17:12:21 EDT Ventricular Rate:  105 PR Interval:  143 QRS Duration:  80 QT Interval:  347 QTC Calculation: 459 R Axis:   36  Text Interpretation: Sinus tachycardia Biatrial enlargement Nonspecific repol abnormality, lateral leads Since last tracing Rate faster Confirmed by Roselyn Dunnings [phone]) on 11/15/2023 1:50:02 PM  Radiology: No results found.  .Critical Care  Performed by: Bernis Ernst, PA-C Authorized by: Bernis Ernst, PA-C   Critical care provider statement:    Critical care time (minutes):  30   Critical care was necessary to  treat or prevent imminent or life-threatening deterioration of the following conditions: anaphylaxis.   Critical care was time spent personally by me on the following activities:  Development of treatment plan with patient or surrogate, evaluation of patient's response to treatment, examination of patient, ordering and performing treatments and interventions, pulse oximetry, re-evaluation of patient's condition, review of old charts and obtaining history from patient or surrogate   Care discussed with: admitting provider      Medications Ordered in the ED  diphenhydrAMINE  (BENADRYL ) injection 50 mg (50 mg Intravenous Given 11/14/23 1713)  famotidine  (PEPCID ) IVPB 20 mg premix (0 mg Intravenous Stopped 11/14/23 1825)  methylPREDNISolone  sodium succinate (SOLU-MEDROL ) 125 mg/2 mL injection 125 mg (125 mg Intravenous Given 11/14/23 1713)  lactated ringers bolus 1,000 mL (0 mLs Intravenous Stopped 11/14/23 1825)  EPINEPHrine  (EPI-PEN) injection 0.3 mg (0.3 mg Intramuscular Given 11/14/23 1712)    Clinical Course as of 11/16/23 1731  Wed Nov 14, 2023  1836 Patient's voice has improved. On phone in no acute distress. Vital signs still show mild tachycardia, but BP has improved.  [RR]  1936 Patient continues to improve. On phone in no acute distress. Reports that he feels that he is improving. Will continue to monitor.  [RR]    Clinical Course User Index [RR] Bernis Ernst, PA-C   Medical Decision Making Risk Prescription drug management.   53 y.o. male presents to the ER for evaluation of allergic reaction/facial swelling, rash. Differential diagnosis includes but is not limited to allergic reaction, anaphylaxis, angioedema. Vital signs Elevated BP and tachycardia, otherwise unremarkable. Physical exam as noted above.   Patient was seen early today at [facility] and was given Decadron  and discharged home with epi pen and other prescriptions.   He appears to still be having an allergic reaction.  EpiPen   was administered in the room.  He was started on Pepcid  and given 50 mg of Benadryl  as well.  I have also given the patient some fluids.  Will continue to monitor.  On multiple reevaluations, patient has not had any repeat of symptoms and reports he overall is feeling better.  Voice seems to be improving.  Patient reports he feels at his baseline of health.  He has been in the ER for over 5 hours without any recurrence of symptoms.  I reviewed how and when to use an epi pen with the patient and wife in the room. The wife already picked up his epi pens and other prescriptions for him written by [facility], so they will have those going home tonight. We discussed when to return to the ER and we discussed anaphylaxis rebound and stressed the importance of following  up with an allergist. I have included an ambulatory referral for one.   We discussed the results need for follow up and plan. The plan is carry epi pen with you at all times, avoid allergy triggers, follow up with allergist. We discussed strict return precautions and red flag symptoms. The patient verbalized their understanding and agrees to the plan. The patient is stable and being discharged home in good condition.  I discussed this case with my attending physician who cosigned this note including patient's presenting symptoms, physical exam, and planned diagnostics and interventions. Attending physician stated agreement with plan or made changes to plan which were implemented.   Attending physician assessed patient at bedside.  Portions of this report may have been transcribed using voice recognition software. Every effort was made to ensure accuracy; however, inadvertent computerized transcription errors may be present.    Final diagnoses:  Anaphylaxis, initial encounter    ED Discharge Orders          Ordered    Ambulatory referral to Allergy        11/14/23 2200               Bernis Ernst, PA-C 11/16/23 1742    Mannie Pac T, DO 11/18/23 1642

## 2023-11-14 NOTE — Discharge Instructions (Addendum)
 Discussed with patient beforehand to the allergy specialist he will need to be cautious with eating certain foods such as chocolate or candies Concerns for new onset of food allergies Ordering an EpiPen and how to use this Patient currently denies any shortness of breath or distress expressed if he does become short of breath he will need to seen in the ER or call an ambulance transport Take anti-itch medicine as needed do not take this medication with Benadryl take 1 every other

## 2023-11-14 NOTE — ED Triage Notes (Signed)
 Pt present with lip swelling that started two hours ago. Pt ate a musketeer and a krackle chocolate bar earlier. Pt is not aware of any allergies to chocolate.   Denies SOB.

## 2023-11-15 ENCOUNTER — Ambulatory Visit: Admitting: Family Medicine

## 2023-11-15 ENCOUNTER — Encounter: Payer: Self-pay | Admitting: Family Medicine

## 2023-11-15 VITALS — BP 141/91 | HR 74 | Ht 74.0 in | Wt 188.0 lb

## 2023-11-15 DIAGNOSIS — E7800 Pure hypercholesterolemia, unspecified: Secondary | ICD-10-CM | POA: Diagnosis not present

## 2023-11-15 DIAGNOSIS — I300 Acute nonspecific idiopathic pericarditis: Secondary | ICD-10-CM

## 2023-11-15 DIAGNOSIS — T7840XA Allergy, unspecified, initial encounter: Secondary | ICD-10-CM

## 2023-11-15 DIAGNOSIS — F1090 Alcohol use, unspecified, uncomplicated: Secondary | ICD-10-CM | POA: Insufficient documentation

## 2023-11-15 DIAGNOSIS — Z1211 Encounter for screening for malignant neoplasm of colon: Secondary | ICD-10-CM | POA: Diagnosis not present

## 2023-11-15 DIAGNOSIS — Z0000 Encounter for general adult medical examination without abnormal findings: Secondary | ICD-10-CM

## 2023-11-15 DIAGNOSIS — Z114 Encounter for screening for human immunodeficiency virus [HIV]: Secondary | ICD-10-CM

## 2023-11-15 DIAGNOSIS — I10 Essential (primary) hypertension: Secondary | ICD-10-CM

## 2023-11-15 DIAGNOSIS — L989 Disorder of the skin and subcutaneous tissue, unspecified: Secondary | ICD-10-CM | POA: Diagnosis not present

## 2023-11-15 DIAGNOSIS — Z1159 Encounter for screening for other viral diseases: Secondary | ICD-10-CM

## 2023-11-15 DIAGNOSIS — F17210 Nicotine dependence, cigarettes, uncomplicated: Secondary | ICD-10-CM | POA: Insufficient documentation

## 2023-11-15 LAB — LIPID PANEL
Cholesterol: 246 mg/dL — ABNORMAL HIGH (ref 0–200)
HDL: 72.3 mg/dL (ref >39.00)
LDL Cholesterol: 162 mg/dL — ABNORMAL HIGH (ref 0–99)
NonHDL: 173.63
Total CHOL/HDL Ratio: 3
Triglycerides: 58 mg/dL (ref 0.0–149.0)
VLDL: 11.6 mg/dL (ref 0.0–40.0)

## 2023-11-15 LAB — CBC WITH DIFFERENTIAL/PLATELET
Basophils Absolute: 0 10*3/uL (ref 0.0–0.1)
Basophils Relative: 0.1 % (ref 0.0–3.0)
Eosinophils Absolute: 0 10*3/uL (ref 0.0–0.7)
Eosinophils Relative: 0 % (ref 0.0–5.0)
HCT: 44.2 % (ref 39.0–52.0)
Hemoglobin: 13.8 g/dL (ref 13.0–17.0)
Lymphocytes Relative: 4.6 % — ABNORMAL LOW (ref 12.0–46.0)
Lymphs Abs: 0.6 10*3/uL — ABNORMAL LOW (ref 0.7–4.0)
MCHC: 31.3 g/dL (ref 30.0–36.0)
MCV: 74.6 fl — ABNORMAL LOW (ref 78.0–100.0)
Monocytes Absolute: 1.1 10*3/uL — ABNORMAL HIGH (ref 0.1–1.0)
Monocytes Relative: 8.3 % (ref 3.0–12.0)
Neutro Abs: 11.1 10*3/uL — ABNORMAL HIGH (ref 1.4–7.7)
Neutrophils Relative %: 87 % — ABNORMAL HIGH (ref 43.0–77.0)
Platelets: 241 10*3/uL (ref 150.0–400.0)
RBC: 5.92 Mil/uL — ABNORMAL HIGH (ref 4.22–5.81)
RDW: 15.9 % — ABNORMAL HIGH (ref 11.5–15.5)
WBC: 12.8 10*3/uL — ABNORMAL HIGH (ref 4.0–10.5)

## 2023-11-15 LAB — COMPREHENSIVE METABOLIC PANEL WITH GFR
ALT: 114 U/L — ABNORMAL HIGH (ref 0–53)
AST: 41 U/L — ABNORMAL HIGH (ref 0–37)
Albumin: 4.8 g/dL (ref 3.5–5.2)
Alkaline Phosphatase: 35 U/L — ABNORMAL LOW (ref 39–117)
BUN: 17 mg/dL (ref 6–23)
CO2: 27 meq/L (ref 19–32)
Calcium: 10 mg/dL (ref 8.4–10.5)
Chloride: 97 meq/L (ref 96–112)
Creatinine, Ser: 0.88 mg/dL (ref 0.40–1.50)
GFR: 98.64 mL/min (ref >60.00)
Glucose, Bld: 112 mg/dL — ABNORMAL HIGH (ref 70–99)
Potassium: 3.8 meq/L (ref 3.5–5.1)
Sodium: 134 meq/L — ABNORMAL LOW (ref 135–145)
Total Bilirubin: 0.7 mg/dL (ref 0.2–1.2)
Total Protein: 7.6 g/dL (ref 6.0–8.3)

## 2023-11-15 LAB — TSH: TSH: 0.38 u[IU]/mL (ref 0.35–5.50)

## 2023-11-15 NOTE — Assessment & Plan Note (Signed)
Medication management: none Lifestyle factors for lowering cholesterol include: Diet therapy - heart-healthy diet rich in fruits, veggies, fiber-rich whole grains, lean meats, chicken, fish (at least twice a week), fat-free or 1% dairy products; foods low in saturated/trans fats, cholesterol, sodium, and sugar. Mediterranean diet has shown to be very heart healthy. Regular exercise - recommend at least 30 minutes a day, 5 times per week Weight management  Repeat CMP and lipid panel today

## 2023-11-15 NOTE — Assessment & Plan Note (Signed)
 Discussed significance of quitting/reducing alcohol.

## 2023-11-15 NOTE — Assessment & Plan Note (Signed)
 Blood pressure is not at goal for age and co-morbidities.  Possibly related to recent steroids.  Recommendations: continue losartan -hctz 50-12.5 mg daily; nurse visit in 1 month - BP goal <130/80 - monitor and log blood pressures at home - check around the same time each day in a relaxed setting - Limit salt to <2000 mg/day - Follow DASH eating plan (heart healthy diet) - limit alcohol to 2 standard drinks per day for men and 1 per day for women - avoid tobacco products - get at least 2 hours of regular aerobic exercise weekly Patient aware of signs/symptoms requiring further/urgent evaluation. Labs updated today.

## 2023-11-15 NOTE — Assessment & Plan Note (Signed)
 No acute symptoms.  Following with cardiology. Patient due to schedule follow-up with them.

## 2023-11-15 NOTE — Progress Notes (Signed)
 New Patient Office Visit  Subjective    Patient ID: Mark Powell, male    DOB: [birth year]  Age: 53 y.o. MRN: [id number]  CC:  Chief Complaint  Patient presents with   Establish Care    HPI Mark Powell presents to establish care. 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    Discussed the use of AI scribe software for clinical note transcription with the patient, who gave verbal consent to proceed.  History of Present Illness Mark Powell is a 53 year old male who presents for a new patient visit and follow-up on a recent allergic reaction.  He experienced a severe allergic reaction after consuming a [employer]keteers candy bar, which he has eaten regularly in the past without issue. He wonders if the reaction might have been triggered by something on his hands from work, as he was handling new label stock. The reaction included swelling of the lips and cheeks, a rash on the neck and head, and eventually difficulty speaking due to throat tightness. He sought urgent care and was later treated in the emergency department with IV Benadryl, steroids, Pepcid, and fluids. He currently feels normal but is awaiting an allergist appointment.  He has a history of high cholesterol, hypertension, and pericarditis. He was diagnosed with pericarditis in 2018, which led to his initial cardiology consultation. He underwent a cardiac catheterization in May 2018. He takes losartan -hydrochlorothiazide  for blood pressure management and has colchicine  available for pericarditis flare-ups, which he uses as needed. He previously used Arcalyst for pericarditis but discontinued it due to skin reactions.  He consumes approximately a bottle of wine daily and occasionally uses a vape pen to aid sleep. He smokes regularly. 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  He experienced a skin reaction to Arcalyst, which he started in November and discontinued in February due to skin changes. The  reaction included itchy, raised spots that have persisted despite stopping the medication. He was not pleased with prior dermatologist and would like a new referral.  He is due for a colonoscopy, having had his last one three years ago at [place].     Hypertension, idiopathic pericarditis: - Medications: losartan -hctz 50-12.5 mg daily ; PRN colchicine  and ibuprofen  for flares (tried Arcalyst but had skin reaction) - Compliance: good - Checking BP at home: no - Denies any SOB, recurrent headaches, CP, vision changes, LE edema, dizziness, palpitations, or medication side effects. - Diet: general  - Exercise:  - Following with Dr. Rolm Clos [facility]) TEE 04/08/21: EF 65-70%, moderate pleural effusion, mild ascending aortic aneurysm 3.5 cm,           Outpatient Encounter Medications as of 11/15/2023  Medication Sig   ARCALYST 220 MG injection Inject into the skin.   EPINEPHrine 0.3 mg/0.3 mL IJ SOAJ injection Inject 0.3 mg into the muscle as needed for anaphylaxis.   hydrOXYzine (ATARAX) 25 MG tablet Take 1 tablet (25 mg total) by mouth every 6 (six) hours.   losartan -hydrochlorothiazide  (HYZAAR) 50-12.5 MG tablet Take 1 tablet by mouth daily.   predniSONE (STERAPRED UNI-PAK 21 TAB) 10 MG (21) TBPK tablet Take by mouth daily. Take 6 tabs by mouth daily  for 2 days, then 5 tabs for 2 days, then 4 tabs for 2 days, then 3 tabs for 2 days, 2 tabs for 2 days, then 1 tab by mouth daily for 2 days   triamcinolone cream (KENALOG) 0.1 % Apply 1 Application topically 2 (  two) times daily as needed.   No facility-administered encounter medications on file as of 11/15/2023.    Past Medical History:  Diagnosis Date   High cholesterol    Hypertension    Pericarditis 2018    Past Surgical History:  Procedure Laterality Date   arm surgery     LEFT HEART CATH AND CORONARY ANGIOGRAPHY N/A 09/26/2016   Procedure: Left Heart Cath and Coronary Angiography;  Surgeon: Millicent Ally, MD;   Location: [facility] INVASIVE CV LAB;  Service: Cardiovascular;  Laterality: N/A;    Family History  Problem Relation Age of Onset   Heart attack Brother 36    Social History   Socioeconomic History   Marital status: Single    Spouse name: Not on file   Number of children: 0   Years of education: Not on file   Highest education level: Not on file  Occupational History   Occupation: Psychiatric nurse - [employer]  Tobacco Use   Smoking status: Some Days    Current packs/day: 0.25    Average packs/day: 0.3 packs/day for 34.0 years (8.5 ttl pk-yrs)    Types: Cigarettes   Smokeless tobacco: Never   Tobacco comments:    07/11/2023 Patient smokes occasionally  Substance and Sexual Activity   Alcohol use: Not Currently    Comment: bottle of wine a day   Drug use: Not Currently    Comment: smokes THC pens   Sexual activity: Not on file  Other Topics Concern   Not on file  Social History Narrative   Not on file   Social Drivers of Health   Financial Resource Strain: Not on file  Food Insecurity: Not on file  Transportation Needs: Not on file  Physical Activity: Not on file  Stress: Not on file  Social Connections: Not on file  Intimate Partner Violence: Not on file    ROS All review of systems negative except what is listed in the HPI       Objective    BP (!) 141/91   Pulse 74   Ht 6' 2 (1.88 m)   Wt 188 lb (85.3 kg)   SpO2 99%   BMI 24.14 kg/m   Physical Exam Vitals reviewed.  Constitutional:      Appearance: Normal appearance.   Cardiovascular:     Rate and Rhythm: Normal rate and regular rhythm.     Heart sounds: Normal heart sounds.  Pulmonary:     Effort: Pulmonary effort is normal.     Breath sounds: Normal breath sounds.   Skin:    General: Skin is warm and dry.   Neurological:     Mental Status: He is alert and oriented to person, place, and time.   Psychiatric:        Mood and Affect: Mood normal.        Behavior: Behavior normal.         Thought Content: Thought content normal.        Judgment: Judgment normal.            Assessment & Plan:   Problem List Items Addressed This Visit       Active Problems   HLD (hyperlipidemia) (Chronic)   Medication management: none Lifestyle factors for lowering cholesterol include: Diet therapy - heart-healthy diet rich in fruits, veggies, fiber-rich whole grains, lean meats, chicken, fish (at least twice a week), fat-free or 1% dairy products; foods low in saturated/trans fats, cholesterol, sodium, and sugar. Mediterranean diet has  shown to be very heart healthy. Regular exercise - recommend at least 30 minutes a day, 5 times per week Weight management  Repeat CMP and lipid panel today       Relevant Orders   Comprehensive metabolic panel with GFR   Lipid panel   HTN (hypertension) (Chronic)   Blood pressure is not at goal for age and co-morbidities.  Possibly related to recent steroids.  Recommendations: continue losartan -hctz 50-12.5 mg daily; nurse visit in 1 month - BP goal <130/80 - monitor and log blood pressures at home - check around the same time each day in a relaxed setting - Limit salt to <2000 mg/day - Follow DASH eating plan (heart healthy diet) - limit alcohol to 2 standard drinks per day for men and 1 per day for women - avoid tobacco products - get at least 2 hours of regular aerobic exercise weekly Patient aware of signs/symptoms requiring further/urgent evaluation. Labs updated today.       Relevant Orders   CBC with Differential/Platelet   Comprehensive metabolic panel with GFR   Lipid panel   TSH   Pericarditis   No acute symptoms.  Following with cardiology. Patient due to schedule follow-up with them.       Cigarette nicotine dependence without complication   Discussed significance of smoking cessation. Not ready to quit.       Alcohol use   Discussed significance of quitting/reducing alcohol.       Other Visit Diagnoses        Encounter for medical examination to establish care          Skin lesion       Relevant Orders   Ambulatory referral to Dermatology     Screen for colon cancer       Relevant Orders   Ambulatory referral to Gastroenterology     Encounter for hepatitis C screening test for low risk patient       Relevant Orders   Hepatitis C antibody     Encounter for screening for HIV          Allergic reaction, initial encounter     Recent anaphylaxis likely triggered by unknown allergen. Treated with IV medications, currently on hydroxyzine and prednisone. Hydroxyzine may cause drowsiness and dehydration. - Referral already placed for allergist. - Advise to avoid suspected allergens until further testing. - Ensure he carries EpiPen at all times.           Return in about 1 month (around 12/15/2023) for BP check with nurse; physical in 6 months .   Everlina Hock, NP

## 2023-11-15 NOTE — Assessment & Plan Note (Signed)
 Discussed significance of smoking cessation. Not ready to quit.

## 2023-11-15 NOTE — Patient Instructions (Signed)

## 2023-11-16 ENCOUNTER — Ambulatory Visit: Payer: Self-pay | Admitting: Family Medicine

## 2023-11-16 DIAGNOSIS — R931 Abnormal findings on diagnostic imaging of heart and coronary circulation: Secondary | ICD-10-CM

## 2023-11-16 DIAGNOSIS — E7800 Pure hypercholesterolemia, unspecified: Secondary | ICD-10-CM

## 2023-11-16 DIAGNOSIS — R7989 Other specified abnormal findings of blood chemistry: Secondary | ICD-10-CM

## 2023-11-16 DIAGNOSIS — R748 Abnormal levels of other serum enzymes: Secondary | ICD-10-CM

## 2023-11-16 LAB — HEPATITIS C ANTIBODY: Hepatitis C Ab: NONREACTIVE

## 2023-11-16 NOTE — Telephone Encounter (Signed)
 CT ordered - Mark Powell - FYI.

## 2023-11-22 ENCOUNTER — Other Ambulatory Visit: Payer: Self-pay | Admitting: Family Medicine

## 2023-11-22 DIAGNOSIS — R790 Abnormal level of blood mineral: Secondary | ICD-10-CM

## 2023-11-22 NOTE — Progress Notes (Signed)
 On record review, ferritin has been elevated in the past. Will add to upcoming labs to recheck.

## 2023-11-27 ENCOUNTER — Ambulatory Visit (HOSPITAL_BASED_OUTPATIENT_CLINIC_OR_DEPARTMENT_OTHER)
Admission: RE | Admit: 2023-11-27 | Discharge: 2023-11-27 | Disposition: A | Discharge status: Home / Self Care | Payer: Self-pay | Source: Ambulatory Visit | Attending: Family Medicine | Admitting: Family Medicine

## 2023-11-27 DIAGNOSIS — E7800 Pure hypercholesterolemia, unspecified: Secondary | ICD-10-CM | POA: Insufficient documentation

## 2023-11-28 MED ORDER — ROSUVASTATIN CALCIUM 20 MG PO TABS: 20.0000 mg | ORAL_TABLET | Freq: Every day | ORAL | 1 refills | Status: DC

## 2023-11-28 NOTE — Addendum Note (Signed)
 Addended by: ALMARIE BIRMINGHAM B on: 11/28/2023 11:57 AM   Modules accepted: Orders

## 2023-12-03 ENCOUNTER — Other Ambulatory Visit: Payer: Self-pay

## 2023-12-03 ENCOUNTER — Emergency Department (HOSPITAL_COMMUNITY)
Admission: EM | Admit: 2023-12-03 | Discharge: 2023-12-03 | Disposition: A | Discharge status: Home / Self Care | Attending: Emergency Medicine | Admitting: Emergency Medicine

## 2023-12-03 ENCOUNTER — Encounter (HOSPITAL_COMMUNITY): Payer: Self-pay | Admitting: Emergency Medicine

## 2023-12-03 DIAGNOSIS — T7840XA Allergy, unspecified, initial encounter: Secondary | ICD-10-CM | POA: Diagnosis not present

## 2023-12-03 DIAGNOSIS — R21 Rash and other nonspecific skin eruption: Secondary | ICD-10-CM | POA: Insufficient documentation

## 2023-12-03 DIAGNOSIS — T466X5A Adverse effect of antihyperlipidemic and antiarteriosclerotic drugs, initial encounter: Secondary | ICD-10-CM | POA: Insufficient documentation

## 2023-12-03 DIAGNOSIS — I10 Essential (primary) hypertension: Secondary | ICD-10-CM | POA: Insufficient documentation

## 2023-12-03 DIAGNOSIS — R000 Tachycardia, unspecified: Secondary | ICD-10-CM | POA: Diagnosis not present

## 2023-12-03 DIAGNOSIS — L509 Urticaria, unspecified: Secondary | ICD-10-CM | POA: Diagnosis not present

## 2023-12-03 DIAGNOSIS — Z79899 Other long term (current) drug therapy: Secondary | ICD-10-CM | POA: Diagnosis not present

## 2023-12-03 DIAGNOSIS — T782XXA Anaphylactic shock, unspecified, initial encounter: Secondary | ICD-10-CM | POA: Diagnosis not present

## 2023-12-03 LAB — BASIC METABOLIC PANEL WITH GFR
Anion gap: 11 (ref 5–15)
BUN: 10 mg/dL (ref 6–20)
CO2: 25 mmol/L (ref 22–32)
Calcium: 8.4 mg/dL — ABNORMAL LOW (ref 8.9–10.3)
Chloride: 102 mmol/L (ref 98–111)
Creatinine, Ser: 0.88 mg/dL (ref 0.61–1.24)
GFR, Estimated: >60 mL/min (ref >60)
Glucose, Bld: 94 mg/dL (ref 70–99)
Potassium: 4.4 mmol/L (ref 3.5–5.1)
Sodium: 138 mmol/L (ref 135–145)

## 2023-12-03 LAB — CBC WITH DIFFERENTIAL/PLATELET
Abs Immature Granulocytes: 0.02 K/uL (ref 0.00–0.07)
Basophils Absolute: 0 K/uL (ref 0.0–0.1)
Basophils Relative: 1 %
Eosinophils Absolute: 0.1 K/uL (ref 0.0–0.5)
Eosinophils Relative: 1 %
HCT: 43.2 % (ref 39.0–52.0)
Hemoglobin: 13.9 g/dL (ref 13.0–17.0)
Immature Granulocytes: 0 %
Lymphocytes Relative: 16 %
Lymphs Abs: 1.2 K/uL (ref 0.7–4.0)
MCH: 24.3 pg — ABNORMAL LOW (ref 26.0–34.0)
MCHC: 32.2 g/dL (ref 30.0–36.0)
MCV: 75.7 fL — ABNORMAL LOW (ref 80.0–100.0)
Monocytes Absolute: 0.7 K/uL (ref 0.1–1.0)
Monocytes Relative: 9 %
Neutro Abs: 5.7 K/uL (ref 1.7–7.7)
Neutrophils Relative %: 73 %
Platelets: 208 K/uL (ref 150–400)
RBC: 5.71 MIL/uL (ref 4.22–5.81)
RDW: 16.9 % — ABNORMAL HIGH (ref 11.5–15.5)
WBC: 7.7 K/uL (ref 4.0–10.5)
nRBC: 0 % (ref 0.0–0.2)

## 2023-12-03 MED ORDER — CETIRIZINE HCL 10 MG PO TABS: 10.0000 mg | ORAL_TABLET | Freq: Every day | ORAL | 0 refills | Status: DC

## 2023-12-03 MED ORDER — EPINEPHRINE 0.3 MG/0.3ML IJ SOAJ: 0.3000 mg | INTRAMUSCULAR | 0 refills | Status: AC | PRN

## 2023-12-03 MED ORDER — FAMOTIDINE 20 MG PO TABS
20.0000 mg | ORAL_TABLET | Freq: Once | ORAL | Status: AC
  Administered 2023-12-03: 20 mg via ORAL
  Filled 2023-12-03: qty 1

## 2023-12-03 MED ORDER — HYDROXYZINE HCL 25 MG PO TABS: 25.0000 mg | ORAL_TABLET | Freq: Three times a day (TID) | ORAL | 0 refills | Status: DC | PRN

## 2023-12-03 MED ORDER — METHYLPREDNISOLONE SODIUM SUCC 125 MG IJ SOLR
125.0000 mg | Freq: Once | INTRAMUSCULAR | Status: AC
  Administered 2023-12-03: 125 mg via INTRAVENOUS
  Filled 2023-12-03: qty 2

## 2023-12-03 MED ORDER — METHYLPREDNISOLONE 4 MG PO TBPK: ORAL_TABLET | ORAL | 0 refills | Status: DC

## 2023-12-03 NOTE — Addendum Note (Signed)
 Addended by: ALMARIE BIRMINGHAM B on: 12/03/2023 11:18 AM   Modules accepted: Orders

## 2023-12-03 NOTE — ED Triage Notes (Signed)
 BIB EMS from work. Took rosuvastatin  Friday (3 days ago) for the first time 12 hours after taking started to feel itching, hives on stomach and SOB. Has been taking benadryl  every 4 hours. Itching worsening today said voice was changing and feeling like throat was closing. Pt denies any other changes that could have caused reaction. No wheezing noted.   6/18- pt was in ED for a similar reaction but had not started cholesterol medicine.   Dr. Levander at bedside during triage  EMS  163/88 100% ra 112 pulse 20LAC 0.3 EPI 50mg  benadryl 

## 2023-12-03 NOTE — Discharge Instructions (Addendum)
 Please stop taking the offending medication  Please follow-up with your primary doctor(s) within 2-3 days. Please avoid any known triggers of your allergies.  We have given you a prescription for an Epi-Pen. Please pick it up as soon as possible. Always carry this with you. Only use the Epi-Pen in the event of a severe allergic reaction with trouble breathing or throat swelling. You must go to the hospital right away if you ever use the Epi-Pen. Remember that they expire every year so you should have your doctor write a new prescription yearly. We have also given you a prescription for prednisone  and an antihistamine. Please pick it up as soon as possible and use as directed.    Please return to the Emergency Department right away if you have any worsening or new shortness of breath, changes in your voice, tightness/itching in your mouth/throat, swelling, severe hives, chest pain, high fever. There is a very small chance of a recurrence of the allergic reaction, typically in the next 24 hours. If you see the same symptoms (rash, trouble breathing, vomiting, etc) return, come back to the Emergency Department immediately.   Please return to the emergency department immediately for any new or concerning symptoms, or if you get worse.

## 2023-12-03 NOTE — ED Provider Notes (Cosign Needed Addendum)
 [place] EMERGENCY DEPARTMENT AT [facility] Provider Note   CSN: [id number] Arrival date & time: 12/03/23  1254     Patient presents with: Allergic Reaction   Mark Powell is a 53 y.o. male with PMHx HTN, HLD who presents to ED concerned for allergic reaction. Patient stating that he has been having rashes since starting a new statin last Friday. Patient has not taken this statin since Friday. Patient stating that he has been taking benadryl  every 4 hours for the rash. Patient then noting a change in his voice, throat swelling, and lip swelling earlier this morning so he called 911. Patient stating that this same symptom happened to him 2 weeks ago before he started taking a statin. Patient stating that he was prescribed an epi pen 2 weeks ago but has lost it.   Patient stated that his symptoms are a lot better ever since EMS provided him with 0.3 EPI.  Patient denies new detergents, lotions, food, clothing. Denies fever, chest pain, dyspnea, cough, nausea, vomiting, diarrhea, dysuria.     Allergic Reaction      Prior to Admission medications   Medication Sig Start Date End Date Taking? Authorizing Provider  ARCALYST 220 MG injection Inject into the skin. 05/29/23   [provider]  EPINEPHrine  0.3 mg/0.3 mL IJ SOAJ injection Inject 0.3 mg into the muscle as needed for anaphylaxis. 12/03/23   Hoy Nidia FALCON, PA-C  hydrOXYzine  (ATARAX ) 25 MG tablet Take 1 tablet (25 mg total) by mouth every 8 (eight) hours as needed. 12/03/23   Almarie Waddell NOVAK, NP  losartan -hydrochlorothiazide  (HYZAAR) 50-12.5 MG tablet Take 1 tablet by mouth daily.    [provider]  predniSONE  (STERAPRED UNI-PAK 21 TAB) 10 MG (21) TBPK tablet Take by mouth daily. Take 6 tabs by mouth daily  for 2 days, then 5 tabs for 2 days, then 4 tabs for 2 days, then 3 tabs for 2 days, 2 tabs for 2 days, then 1 tab by mouth daily for 2 days 11/14/23   Merilee Andrea CROME, NP  triamcinolone cream  (KENALOG) 0.1 % Apply 1 Application topically 2 (two) times daily as needed. 07/04/23   [provider]    Allergies: Rosuvastatin  and Arcalyst [rilonacept]    Review of Systems  Allergic/Immunologic:       Allergic reaction    Updated Vital Signs BP (!) 167/96   Pulse 98   Temp 98.2 F (36.8 C) (Oral)   Resp 15   SpO2 100%   Physical Exam Vitals and nursing note reviewed.  Constitutional:      General: He is not in acute distress.    Appearance: He is not ill-appearing or toxic-appearing.  HENT:     Head: Normocephalic and atraumatic.     Mouth/Throat:     Mouth: Mucous membranes are moist.     Pharynx: Oropharynx is clear.     Comments: No oropharyngeal swelling.  Eyes:     General: No scleral icterus.       Right eye: No discharge.        Left eye: No discharge.     Conjunctiva/sclera: Conjunctivae normal.  Cardiovascular:     Rate and Rhythm: Normal rate and regular rhythm.     Pulses: Normal pulses.     Heart sounds: Normal heart sounds. No murmur heard. Pulmonary:     Effort: Pulmonary effort is normal. No respiratory distress.     Breath sounds: Normal breath sounds. No wheezing, rhonchi or  rales.  Abdominal:     General: Abdomen is flat.  Musculoskeletal:     Right lower leg: No edema.     Left lower leg: No edema.  Skin:    General: Skin is warm and dry.     Findings: No rash.  Neurological:     General: No focal deficit present.     Mental Status: He is alert and oriented to person, place, and time. Mental status is at baseline.  Psychiatric:        Mood and Affect: Mood normal.        Behavior: Behavior normal.     (all labs ordered are listed, but only abnormal results are displayed) Labs Reviewed  CBC WITH DIFFERENTIAL/PLATELET  BASIC METABOLIC PANEL WITH GFR    EKG: None  Radiology: No results found.   Procedures   Medications Ordered in the ED  famotidine  (PEPCID ) tablet 20 mg (has no administration in time range)                                     Medical Decision Making Amount and/or Complexity of Data Reviewed Labs: ordered.  Risk Prescription drug management.   This patient presents to the ED for concern of allergic reaction, this involves an extensive number of treatment options, and is a complaint that carries with it a high risk of complications and morbidity.  The differential diagnosis includes anaphylaxis, environmental allergies.  This is not an exhaustive list.   Co morbidities that complicate the patient evaluation  HTN, HLD    Additional history obtained:  Dr. Almarie PCP    Problem List / ED Course / Critical interventions / Medication management  Patient presents to ED concern for allergic reaction.  Patient endorsing lip swelling, voice change, throat swelling this morning which has been resolving appropriately after receiving 0.3 epi with EMS.  Physical exam reassuring.  Patient afebrile with stable vitals. I Ordered, and personally interpreted labs.  Lab workup is pending. Patient will need to be monitored for 3-4 hours after receiving Epi. I have reviewed the patients home medicines and have made adjustments as needed   Social Determinants of Health:  none   3PM Care of Mark Powell transferred to [employer] at the end of my shift as the patient will require reassessment once labs/imaging have resulted. Patient presentation, ED course, and plan of care discussed with review of all pertinent labs and imaging. Please see his/her note for further details regarding further ED course and disposition. Plan at time of handoff is reassess patient at the 3-4 hour mark. This may be altered or completely changed at the discretion of the oncoming team pending results of further workup.      Final diagnoses:  Allergic reaction, initial encounter    ED Discharge Orders          Ordered    EPINEPHrine  0.3 mg/0.3 mL IJ SOAJ injection  As needed        12/03/23 1425               Hoy Nidia FALCON, [place] 12/03/23 1516    Levander Houston, MD 12/06/23 [phone]

## 2023-12-03 NOTE — Addendum Note (Signed)
 Addended by: Jezelle Gullick L on: 12/03/2023 10:50 AM   Modules accepted: Orders

## 2023-12-03 NOTE — Telephone Encounter (Signed)
 Looks like he was given a 3 day supply a month ago. Pended. Sign if appropriate.

## 2023-12-03 NOTE — ED Provider Notes (Signed)
  Physical Exam   Vitals:   12/03/23 1304 12/03/23 1315 12/03/23 1500  BP: (!) 167/96  (!) 142/102  Pulse: 100 98 100  Resp: 11 15 19   Temp: 98.2 F (36.8 C)    TempSrc: Oral    SpO2: 100% 100% 100%     Physical Exam Vitals and nursing note reviewed.  HENT:     Head: Normocephalic.     Comments: No swelling of lips/tongue/throat Eyes:     Extraocular Movements: Extraocular movements intact.     Pupils: Pupils are equal, round, and reactive to light.  Cardiovascular:     Rate and Rhythm: Normal rate.  Pulmonary:     Effort: Pulmonary effort is normal.  Musculoskeletal:     Cervical back: Normal range of motion.     Comments: Moves all extremities spontaneously without difficulty  Skin:    Comments: Scattered hives primarily on right upper chest/right lateral chest wall/right upper back  Neurological:     Mental Status: He is alert and oriented to person, place, and time.     Procedures  Procedures  ED Course / MDM    Medical Decision Making Amount and/or Complexity of Data Reviewed Labs: ordered.  Risk OTC drugs. Prescription drug management.   Patient received at shift change from prior [facility]. See their note for initial history, physical exam findings, medication management, initial assessment/plan.  Patient presented to the emergency department with allergic reaction, patient started on a statin on Friday and has been having rashes since then, he has been self-medicating at home with Benadryl .  Today he noticed a change in his voice with throat/lip swelling, epinephrine  and Benadryl  were administered with EMS.  Patient developed similar symptoms several weeks ago and was prescribed an EpiPen  but admits that he lost it.   Pending reassessment after period of observation, patient was prescribed EpiPen  by prior PA-C.   CBC and BMP non-contributory  At time of my reassessment, patient no longer demonstrating swelling of the lips/tongue/throat,  however he still does have hives primarily on his upper extremities and right chest that are itching.  His voice is mildly hoarse but he reports that this is an improvement from his initial presentation to the emergency department earlier in the day, he is managing his secretions without difficulty.  Will administer Solu-Medrol  and re-assess.   At time of reassessment, patient's symptoms have continued to improve.  He is appropriate for discharge at this time, will prescribe Medrol  Dosepak and Zyrtec .  Patient has follow-up scheduled with his PCP, I provided him with the information for an allergist he can schedule follow-up with that as well.  Patient voiced understanding and is in agreement this plan, strict return precautions discussed.         Glendia Rocky SAILOR, [place] 12/03/23 1733    Elnor Jayson LABOR, DO 12/04/23 0006

## 2023-12-03 NOTE — Addendum Note (Signed)
 Addended by: ALMARIE BIRMINGHAM B on: 12/03/2023 10:07 AM   Modules accepted: Orders

## 2023-12-10 NOTE — Progress Notes (Unsigned)
 Pt here for Blood pressure check per Waddell  Pt currently takes: Hyzaar 50-12.5mg , daily   Pt reports compliance with medication.  BP today @ = 128/88 HR = 80  Pt advised per Dr. Amon to continue medication as is. Monitor bps at home and keep scheduled appointment in December

## 2023-12-11 ENCOUNTER — Ambulatory Visit (INDEPENDENT_AMBULATORY_CARE_PROVIDER_SITE_OTHER)

## 2023-12-11 VITALS — BP 128/88 | HR 80

## 2023-12-11 DIAGNOSIS — I10 Essential (primary) hypertension: Secondary | ICD-10-CM | POA: Diagnosis not present

## 2023-12-13 ENCOUNTER — Other Ambulatory Visit: Payer: Self-pay

## 2023-12-13 ENCOUNTER — Ambulatory Visit: Admitting: Allergy

## 2023-12-13 ENCOUNTER — Encounter: Payer: Self-pay | Admitting: Allergy

## 2023-12-13 VITALS — BP 128/70 | HR 99 | Temp 98.2°F | Resp 20 | Ht 72.5 in | Wt 182.1 lb

## 2023-12-13 DIAGNOSIS — T7840XD Allergy, unspecified, subsequent encounter: Secondary | ICD-10-CM

## 2023-12-13 DIAGNOSIS — T783XXD Angioneurotic edema, subsequent encounter: Secondary | ICD-10-CM | POA: Diagnosis not present

## 2023-12-13 DIAGNOSIS — T783XXA Angioneurotic edema, initial encounter: Secondary | ICD-10-CM

## 2023-12-13 DIAGNOSIS — T7840XA Allergy, unspecified, initial encounter: Secondary | ICD-10-CM

## 2023-12-13 MED ORDER — FAMOTIDINE 20 MG PO TABS: 20.0000 mg | ORAL_TABLET | Freq: Two times a day (BID) | ORAL | 2 refills | Status: AC

## 2023-12-13 NOTE — Progress Notes (Signed)
 New Patient Note  RE: Mark Powell MRN: [id number] DOB: 19-May-1971 Date of Office Visit: 12/13/2023  Consult requested by: Bernis Ernst, PA-C Primary care provider: Almarie Waddell NOVAK, NP  Chief Complaint: Establish Care (Ed visits x 2 after chocolate and after statin. Has epi)  History of Present Illness: I had the pleasure of seeing Mark Powell for initial evaluation at the [facility] on 12/13/2023. He is a 53 y.o. male, who is referred here by Almarie Waddell NOVAK, NP for the evaluation of allergic reaction.  Discussed the use of AI scribe software for clinical note transcription with the patient, who gave verbal consent to proceed.    In June, he experienced his first episode of lip and facial swelling after consuming a Hershey bar and a Crackle bar at work. He initially sought care at urgent care where he received a shot and was prescribed an EpiPen , hydroxyzine , and prednisone . Later that day, his symptoms worsened with throat and neck swelling, leading to an ER visit where he was treated with an EpiPen  and Benadryl . The swelling improved gradually over a four-hour observation period.  On July 4th, he began taking rosuvastatin  for cholesterol management. That evening, he developed a rash and itchiness, particularly on his scalp, after consuming shrimp and ribs. He managed the symptoms with Benadryl , but the rash and itchiness persisted over the weekend. On the following Monday, he experienced throat swelling again, similar to the initial episode, despite not having eaten that day. He was treated with an EpiPen  by EMS on the way to the ER, which provided relief.  He has been taking Zyrtec  daily since these episodes and carries an EpiPen  for emergencies. He has a history of pericarditis, for which he was previously treated with Arcalyst injections, but discontinued in February due to reactions. He also takes medication for blood pressure and has a prescription for  hydroxyzine  as needed. He recently completed a course of prednisone .  No known food allergies, no history of asthma, and only experiences seasonal allergies in the spring. He smokes about two packs of cigarettes a week and is attempting to quit. He has a dog for four years and no family history of similar allergic reactions.   He had a recent tick bite and does consume red meat quite frequently.     Hostess coffee cake ingredient information:      Hershey's Krackel ingredients: Milk Chocolate (Sugar, Chocolate, Cocoa Butter, Nonfat Milk, Milk Fat, Soy Lecithin, PGPR, Emulsifier, Vanillin, Artificial Flavor), Crisp Rice (Rice Flour, Sugar, Malt Extract, Salt, Monoglycerides, Tocopherols, to [employer]).  11/14/2023 UC visit: Patient presents today with lip swelling, rash to face neck and abdomen area after eating a musketeer and a Krackle chocolate candy approximately 2 hours ago. Patient states that he has never had problems with eating chocolate in the past he was eating Reese's cups last night with no problems. Denies any chest pain no shortness of breath. Has not started any new medications. Has not had any any new soaps or lotions.   11/14/2023 ER visit: Mark Powell is a 53 y.o. male with h/o HTN on losartan  presents to the ER today for evaluation of allergic reaction. The patient reports that he had had a crackle bar and another piece of chocolate and around 15 minutes later found that his face/lips were swollen and he had a rash. He went to [facility] and was given decadron , sent in prescriptions, and sent home. He reports that he went home and  started to feel the symptoms again and his wife saw him and brought him into the ER. He reports his throat feels scratchy, but denies SOB or chest pain. He reports that he has never had an allergic reaction before and has not had any new medications or products.   53 y.o. male presents to the ER for evaluation of allergic reaction/facial  swelling, rash. Differential diagnosis includes but is not limited to allergic reaction, anaphylaxis, angioedema. Vital signs Elevated BP and tachycardia, otherwise unremarkable. Physical exam as noted above.    Patient was seen early today at [facility] and was given Decadron  and discharged home with epi pen and other prescriptions.    He appears to still be having an allergic reaction.  EpiPen  was administered in the room.  He was started on Pepcid  and given 50 mg of Benadryl  as well.  I have also given the patient some fluids.  Will continue to monitor.   On multiple reevaluations, patient has not had any repeat of symptoms and reports he overall is feeling better.  Voice seems to be improving.  Patient reports he feels at his baseline of health.  He has been in the ER for over 5 hours without any recurrence of symptoms.   I reviewed how and when to use an epi pen with the patient and wife in the room. The wife already picked up his epi pens and other prescriptions for him written by UC, so they will have those going home tonight. We discussed when to return to the ER and we discussed anaphylaxis rebound and stressed the importance of following up with an allergist. I have included an ambulatory referral for one.  12/03/2023 ER visit: Mark Powell is a 53 y.o. male with PMHx HTN, HLD who presents to ED concerned for allergic reaction. Patient stating that he has been having rashes since starting a new statin last Friday. Patient has not taken this statin since Friday. Patient stating that he has been taking benadryl  every 4 hours for the rash. Patient then noting a change in his voice, throat swelling, and lip swelling earlier this morning so he called 911. Patient stating that this same symptom happened to him 2 weeks ago before he started taking a statin. Patient stating that he was prescribed an epi pen 2 weeks ago but has lost it.    Patient stated that his symptoms are a lot better ever since EMS  provided him with 0.3 EPI.   Patient denies new detergents, lotions, food, clothing. Denies fever, chest pain, dyspnea, cough, nausea, vomiting, diarrhea, dysuria.  Assessment and Plan: Mark Powell is a 53 y.o. male with: Allergic reaction, initial encounter Angioedema  2 episodes of allergic reaction involving facial and neck swelling with voice change. No triggers noted. Some concern for alpha-gal allergy given recent tick bite and consuming beef the day before these reactions. Denies changes in diet, personal care products. With the second episode he took a new statin medication.  I'm not sure what caused your reaction. Keep track of episodes. Write down what you had done or eaten the day before and the day of reaction. Limit your red meat intake - beef, pork and lamb.  Limit alcohol intake. Start zyrtec  (cetirizine ) 10mg  once a day. If symptoms are not controlled or causes drowsiness let us  know. Start Pepcid  (famotidine ) 20mg  twice a day.  Avoid the following potential triggers: alcohol, tight clothing, NSAIDs, hot showers and getting overheated. See below for proper skin care.  Get bloodwork.  If all negative, consider changing ARB antihypertensive medication as sometimes ARBs can cause angioedema.  For mild symptoms you can take over the counter antihistamines (zyrtec  10mg  to 20mg ) and monitor symptoms closely.  If symptoms worsen or if you have severe symptoms including breathing issues, throat closure, significant swelling, whole body hives, severe diarrhea and vomiting, lightheadedness then use epinephrine  and seek immediate medical care afterwards. Emergency action plan given.  Return in about 4 weeks (around 01/10/2024).  Meds ordered this encounter  Medications   famotidine  (PEPCID ) 20 MG tablet    Sig: Take 1 tablet (20 mg total) by mouth 2 (two) times daily.    Dispense:  60 tablet    Refill:  2   Lab Orders         Allergen Fire Ant         Alpha-Gal Panel         ANA,  IFA (with reflex)         C3 and C4         C-reactive protein         Comprehensive metabolic panel with GFR         Sedimentation rate         Hymenoptera Venom Allergy II         Food Allergy Profile         Allergen, Rice, f9         Allergen, Vanilla, [id number]         Chocolate IgE         Allergen, Cinnamon [id number]         Corn IgE      Other allergy screening: Asthma: no Rhino conjunctivitis: yes In the spring.  Food allergy: no Medication allergy: yes Hymenoptera allergy: no Urticaria: no Eczema:no History of recurrent infections suggestive of immunodeficency: no  Diagnostics: None.   Past Medical History: Patient Active Problem List   Diagnosis Date Noted   Cigarette nicotine dependence without complication 11/15/2023   Alcohol use 11/15/2023   Pericarditis 04/07/2021   Hypokalemia 10/12/2016   Acute pericarditis 09/27/2016   Chest pain 09/26/2016   HLD (hyperlipidemia) 09/26/2016   HTN (hypertension) 09/26/2016   Chest pain with high risk for cardiac etiology    Past Medical History:  Diagnosis Date   High cholesterol    Hypertension    Pericarditis 2018   Past Surgical History: Past Surgical History:  Procedure Laterality Date   arm surgery     LEFT HEART CATH AND CORONARY ANGIOGRAPHY N/A 09/26/2016   Procedure: Left Heart Cath and Coronary Angiography;  Surgeon: Debby DELENA Sor, MD;  Location: [facility] INVASIVE CV LAB;  Service: Cardiovascular;  Laterality: N/A;   Medication List:  Current Outpatient Medications  Medication Sig Dispense Refill   cetirizine  (ZYRTEC  ALLERGY) 10 MG tablet Take 1 tablet (10 mg total) by mouth daily. 30 tablet 0   EPINEPHrine  0.3 mg/0.3 mL IJ SOAJ injection Inject 0.3 mg into the muscle as needed for anaphylaxis. 1 each 0   famotidine  (PEPCID ) 20 MG tablet Take 1 tablet (20 mg total) by mouth 2 (two) times daily. 60 tablet 2   hydrOXYzine  (ATARAX ) 25 MG tablet Take 1 tablet (25 mg total) by mouth every 8 (eight) hours as needed.  90 tablet 0   losartan -hydrochlorothiazide  (HYZAAR) 50-12.5 MG tablet Take 1 tablet by mouth daily.     triamcinolone cream (KENALOG) 0.1 % Apply 1 Application topically 2 (two) times daily as needed.  No current facility-administered medications for this visit.   Allergies: Allergies  Allergen Reactions   Rosuvastatin  Rash   Arcalyst [Rilonacept]     Skin changes    Social History: Social History   Socioeconomic History   Marital status: Married    Spouse name: Not on file   Number of children: 0   Years of education: Not on file   Highest education level: Not on file  Occupational History   Occupation: Psychiatric nurse - [employer]  Tobacco Use   Smoking status: Some Days    Current packs/day: 0.25    Average packs/day: 0.3 packs/day for 34.0 years (8.5 ttl pk-yrs)    Types: Cigarettes   Smokeless tobacco: Never   Tobacco comments:    07/11/2023 Patient smokes occasionally    12/13/2023 smokes  2 pack a day since age 34  Substance and Sexual Activity   Alcohol use: Not Currently    Comment: bottle of wine a day   Drug use: Not Currently    Comment: smokes THC pens   Sexual activity: Not on file  Other Topics Concern   Not on file  Social History Narrative   Not on file   Social Drivers of Health   Financial Resource Strain: Not on file  Food Insecurity: Not on file  Transportation Needs: Not on file  Physical Activity: Not on file  Stress: Not on file  Social Connections: Not on file   Lives in a house. Smoking: 2 packs per week Occupation: Armed forces training and education officer History: Water Damage/mildew in the house: no Engineer, civil (consulting) in the family room: no Carpet in the bedroom: no Heating: gas Cooling: central Pet: yes 1 dog x 4 yrs  Family History: Family History  Problem Relation Age of Onset   Heart attack Brother 23   Problem                               Relation Asthma                                   no Eczema                                no Food  allergy                          no Allergic rhino conjunctivitis     no  Review of Systems  Constitutional:  Negative for appetite change, chills, fever and unexpected weight change.  HENT:  Negative for congestion and rhinorrhea.   Eyes:  Negative for itching.  Respiratory:  Negative for cough, chest tightness, shortness of breath and wheezing.   Cardiovascular:  Negative for chest pain.  Gastrointestinal:  Negative for abdominal pain.  Genitourinary:  Negative for difficulty urinating.  Skin:  Negative for rash.  Neurological:  Negative for headaches.    Objective: BP 128/70   Pulse 99   Temp 98.2 F (36.8 C) (Temporal)   Resp 20   Ht 6' 0.5 (1.842 m)   Wt 182 lb 1.9 oz (82.6 kg)   SpO2 98%   BMI 24.36 kg/m  Body mass index is 24.36 kg/m. Physical Exam Vitals and nursing note reviewed.  Constitutional:      Appearance: Normal appearance.  He is well-developed.  HENT:     Head: Normocephalic and atraumatic.     Right Ear: Tympanic membrane and external ear normal.     Left Ear: Tympanic membrane and external ear normal.     Nose: Nose normal.     Mouth/Throat:     Mouth: Mucous membranes are moist.     Pharynx: Oropharynx is clear.  Eyes:     Conjunctiva/sclera: Conjunctivae normal.  Cardiovascular:     Rate and Rhythm: Normal rate and regular rhythm.     Heart sounds: Normal heart sounds. No murmur heard.    No friction rub. No gallop.  Pulmonary:     Effort: Pulmonary effort is normal.     Breath sounds: Normal breath sounds. No wheezing, rhonchi or rales.  Musculoskeletal:     Cervical back: Neck supple.  Skin:    General: Skin is warm.     Findings: No rash.  Neurological:     Mental Status: He is alert and oriented to person, place, and time.  Psychiatric:        Behavior: Behavior normal.    The plan was reviewed with the patient/family, and all questions/concerned were addressed.  It was my pleasure to see Amed today and participate in his  care. Please feel free to contact me with any questions or concerns.  Sincerely,  Orlan Cramp, DO Allergy & Immunology  [facility]  [facility] office: [phone] [facility] office: [phone]

## 2023-12-13 NOTE — Patient Instructions (Addendum)
 Allergic reaction I'm not sure what caused your reaction. Keep track of episodes. Write down what you had done or eaten the day before and the day of reaction. Limit your red meat intake - beef, pork and lamb.  Limit alcohol intake.  Start zyrtec  (cetirizine ) 10mg  once a day. If symptoms are not controlled or causes drowsiness let us  know. Start Pepcid  (famotidine ) 20mg  twice a day.  Avoid the following potential triggers: alcohol, tight clothing, NSAIDs, hot showers and getting overheated. See below for proper skin care.  Get bloodwork. We are ordering labs, so please allow 1-2 weeks for the results to come back. With the newly implemented Cures Act, the labs might be visible to you at the same time that they become visible to me. However, I will not address the results until all of the results are back, so please be patient.   For mild symptoms you can take over the counter antihistamines (zyrtec  10mg  to 20mg ) and monitor symptoms closely.  If symptoms worsen or if you have severe symptoms including breathing issues, throat closure, significant swelling, whole body hives, severe diarrhea and vomiting, lightheadedness then use epinephrine  and seek immediate medical care afterwards. Emergency action plan given.   Follow up 1 month or sooner if needed.

## 2023-12-14 DIAGNOSIS — T7840XA Allergy, unspecified, initial encounter: Secondary | ICD-10-CM | POA: Diagnosis not present

## 2023-12-19 LAB — FOOD ALLERGY PROFILE
Allergen Corn, IgE: <0.1 kU/L
Clam IgE: <0.1 kU/L
Codfish IgE: <0.1 kU/L
Egg White IgE: 0.22 kU/L — AB
Milk IgE: 0.1 kU/L — AB
Peanut IgE: <0.1 kU/L
Scallop IgE: <0.1 kU/L
Sesame Seed IgE: <0.1 kU/L
Shrimp IgE: <0.1 kU/L
Soybean IgE: <0.1 kU/L
Walnut IgE: <0.1 kU/L
Wheat IgE: <0.1 kU/L

## 2023-12-19 LAB — HYMENOPTERA VENOM ALLERGY II
Bumblebee: <0.1 kU/L
Hornet, White Face, IgE: 0.45 kU/L — AB
Hornet, Yellow, IgE: 0.14 kU/L — AB
I001-IgE Honeybee: <0.1 kU/L
I003-IgE Yellow Jacket: 0.18 kU/L — AB
I004-IgE Paper Wasp: 0.14 kU/L — AB
I208-IgE Api m 1: <0.1 kU/L
I209-IgE Ves v 5: 0.1 kU/L — AB
I210-IgE Pol d 5: <0.1 kU/L
I211-IgE Ves v 1: <0.1 kU/L
I214-IgE Api m 2: <0.1 kU/L
I215-IgE Api m 3: <0.1 kU/L
I216-IgE Api m 5: <0.1 kU/L
I217-IgE Api m 10: <0.1 kU/L
Tryptase: 3.4 ug/L (ref 2.2–13.2)

## 2023-12-19 LAB — COMPREHENSIVE METABOLIC PANEL WITH GFR
ALT: 165 IU/L — ABNORMAL HIGH (ref 0–44)
AST: 75 IU/L — ABNORMAL HIGH (ref 0–40)
Albumin: 4.3 g/dL (ref 3.8–4.9)
Alkaline Phosphatase: 43 IU/L — ABNORMAL LOW (ref 44–121)
BUN/Creatinine Ratio: 14 (ref 9–20)
BUN: 13 mg/dL (ref 6–24)
Bilirubin Total: 0.9 mg/dL (ref 0.0–1.2)
CO2: 20 mmol/L (ref 20–29)
Calcium: 9.3 mg/dL (ref 8.7–10.2)
Chloride: 95 mmol/L — ABNORMAL LOW (ref 96–106)
Creatinine, Ser: 0.95 mg/dL (ref 0.76–1.27)
Globulin, Total: 2.5 g/dL (ref 1.5–4.5)
Glucose: 126 mg/dL — ABNORMAL HIGH (ref 70–99)
Potassium: 3.6 mmol/L (ref 3.5–5.2)
Sodium: 133 mmol/L — ABNORMAL LOW (ref 134–144)
Total Protein: 6.8 g/dL (ref 6.0–8.5)
eGFR: 96 mL/min/1.73 (ref >59)

## 2023-12-19 LAB — ALPHA-GAL PANEL
Allergen Lamb IgE: 0.12 kU/L — AB
Beef IgE: 0.21 kU/L — AB
IgE (Immunoglobulin E), Serum: 458 [IU]/mL (ref 6–495)
O215-IgE Alpha-Gal: 1.1 kU/L — AB
Pork IgE: 0.1 kU/L — AB

## 2023-12-19 LAB — ALLERGEN, VANILLA, RF234: Vanilla: <0.1 kU/L

## 2023-12-19 LAB — ALLERGEN COMPONENT COMMENTS

## 2023-12-19 LAB — C3 AND C4
Complement C3, Serum: 120 mg/dL (ref 82–167)
Complement C4, Serum: 17 mg/dL (ref 12–38)

## 2023-12-19 LAB — C-REACTIVE PROTEIN: CRP: 5 mg/L (ref 0–10)

## 2023-12-19 LAB — ANTINUCLEAR ANTIBODIES, IFA: ANA Titer 1: NEGATIVE

## 2023-12-19 LAB — SEDIMENTATION RATE: Sed Rate: 6 mm/h (ref 0–30)

## 2023-12-19 LAB — ALLERGEN, CINNAMON, RF220: Allergen Cinnamon IgE: <0.1 kU/L

## 2023-12-19 LAB — O214-IGE CCD DETERMINANTS: O214-IgE CCD Determinants: <0.1 kU/L

## 2023-12-19 LAB — ALLERGEN, RICE, F9: Allergen Rice IgE: <0.1 kU/L

## 2023-12-19 LAB — ALLERGEN CHOCOLATE: Chocolate/Cacao IgE: <0.1 kU/L

## 2023-12-19 LAB — ALLERGEN FIRE ANT: I070-IgE Fire Ant (Invicta): <0.1 kU/L

## 2023-12-20 ENCOUNTER — Ambulatory Visit: Payer: Self-pay | Admitting: Allergy

## 2024-01-08 ENCOUNTER — Encounter: Payer: Self-pay | Admitting: Allergy

## 2024-01-08 ENCOUNTER — Ambulatory Visit (INDEPENDENT_AMBULATORY_CARE_PROVIDER_SITE_OTHER): Admitting: Allergy

## 2024-01-08 ENCOUNTER — Other Ambulatory Visit: Payer: Self-pay

## 2024-01-08 VITALS — BP 138/86 | HR 78 | Temp 98.6°F | Resp 18 | Ht 72.44 in | Wt 186.0 lb

## 2024-01-08 DIAGNOSIS — T783XXD Angioneurotic edema, subsequent encounter: Secondary | ICD-10-CM

## 2024-01-08 DIAGNOSIS — Z888 Allergy status to other drugs, medicaments and biological substances status: Secondary | ICD-10-CM | POA: Diagnosis not present

## 2024-01-08 DIAGNOSIS — T7840XD Allergy, unspecified, subsequent encounter: Secondary | ICD-10-CM

## 2024-01-08 DIAGNOSIS — Z91018 Allergy to other foods: Secondary | ICD-10-CM | POA: Diagnosis not present

## 2024-01-08 DIAGNOSIS — T50995D Adverse effect of other drugs, medicaments and biological substances, subsequent encounter: Secondary | ICD-10-CM

## 2024-01-08 NOTE — Progress Notes (Signed)
 Follow Up Note  RE: Mark Powell MRN: [id number] DOB: [birth year] Date of Office Visit: 01/08/2024  Referring provider: Almarie Waddell NOVAK, NP Primary care provider: Almarie Waddell NOVAK, NP  Chief Complaint: Allergic Reaction (Still avoiding red meats and has come questions about that and stopped the statin medication after taking 1 pill on 11/30/23 and it caused an allergic reaction mid-day )  History of Present Illness: I had the pleasure of seeing Mark Powell for a follow up visit at the [facility] on 01/08/2024. Mark Powell is a 53 y.o. 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 His previous allergy  office visit was on 12/13/2023 with Dr. Luke. Today is a regular follow up visit.  Discussed the use of AI scribe software for clinical note transcription with the patient, who gave verbal consent to proceed.    Mark Powell has not experienced any episodes of swelling or hives since his last visit a month ago. Mark Powell has been taking Zyrtec  and famotidine  daily and has not had any allergic episodes since starting them. Mark Powell has slightly reduced his alcohol intake and continues to consume red meat cautiously.  His second allergic occurrence coincided with the initiation of Crestor , a medication for cholesterol, where Mark Powell experienced hives approximately four to five hours after taking it. 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   Mark Powell carries two EpiPens at all times. Mark Powell has a history of taking Lipitor for cholesterol management, which was changed to Crestor  by a different doctor. Mark Powell is considering discussing with his primary care physician about switching back to Lipitor due to concerns about potential allergic reactions to Crestor .  No recent episodes of swelling, hives, or heartburn.     2025 labs: Blood count, kidney function, electrolytes, autoimmune  screener, inflammation markers, tryptase (checks for mast cell issues) were all normal which is great.    Your alpha gal (checks for red meat allergy ) was slightly elevated. Please avoid all mammalian meat - this includes pork, beef, lamb.  Stinging insect panel was borderline positive to white face hornet, yellow hornet, yellow jacket and wasp. Food panel was borderline to egg and milk - avoid from diet until next week and will discuss when you can resume.  Rice, vanilla, chocolate, cinnamon was all negative.    Based on these lab results and clinical history - I wonder if you had a delayed reaction to red meat due to your alpha-gal allergy .    Take zyrtec  and famotidine  as discussed.   Keep follow up appointment in August.    Your liver function was elevated. Please limit alcohol intake and follow up with GI.  Assessment and Plan: Mark Powell is a 53 y.o. male with: Allergic reaction, subsequent encounter Angioedema, subsequent encounter Allergy  to alpha-gal Past history - 2 episodes of allergic reaction involving facial and neck swelling with voice change. No triggers noted. Some concern for alpha-gal allergy  given recent tick bite and consuming beef the day before these reactions. Denies changes in diet, personal care products. With the second episode Mark Powell took a new statin medication (Crestor ). Interim history - 2025 labs slightly positive to alpha-gal, egg and milk. Borderline positive to mixed vespids and wasp. No additional episodes and has been consuming red meat here and there with no issues and wants to reintroduce. Mark Powell also wants to start taking a statin due to his cholesterol.  Keep track of episodes. Slowly reintroduce red meat back into your diet. If you have issues let us  know and stop. Discussed that there is spectrum of disease for alpha-gal and given his borderline values not sure if this was a trigger for him or not.  Limit alcohol intake. Continue zyrtec  (cetirizine ) 10mg   once a day. If symptoms are not controlled or causes drowsiness let us  know. Continue Pepcid  (famotidine ) 20mg  once a day.  Avoid the following potential triggers: alcohol, tight clothing, NSAIDs, hot showers and getting overheated. For mild symptoms you can take over the counter antihistamines (zyrtec  10mg  to 20mg ) and monitor symptoms closely.  If symptoms worsen or if you have severe symptoms including breathing issues, throat closure, significant swelling, whole body hives, severe diarrhea and vomiting, lightheadedness then use epinephrine  and seek immediate medical care afterwards. Emergency action plan in place.  Gave handout on alpha-gal allergy . Step down schedule - after 1 month Stop Pepcid  20mg . Continue zyrtec  10mg  once a day.   Possible reaction to rosuvastatin  Patient was on Lipitor before without known reaction.  Advised patient to switch to Lipitor - if has any issues then let us  know. Emphasized that diet (avoiding red meat) and exercise would be the best for cholesterol management but patient wants to consume red meat.   Return in about 3 months (around 04/09/2024).  No orders of the defined types were placed in this encounter.  Lab Orders  No laboratory test(s) ordered today    Diagnostics: None.   Medication List:  Current Outpatient Medications  Medication Sig Dispense Refill   cetirizine  (ZYRTEC  ALLERGY ) 10 MG tablet Take 1 tablet (10 mg total) by mouth daily. 30 tablet 0   EPINEPHrine  0.3 mg/0.3 mL IJ SOAJ injection Inject 0.3 mg into the muscle as needed for anaphylaxis. 1 each 0   famotidine  (PEPCID ) 20 MG tablet Take 1 tablet (20 mg total) by mouth 2 (two) times daily. 60 tablet 2   hydrOXYzine  (ATARAX ) 25 MG tablet Take 1 tablet (25 mg total) by mouth every 8 (eight) hours as needed. 90 tablet 0   losartan -hydrochlorothiazide  (HYZAAR) 50-12.5 MG tablet Take 1 tablet by mouth daily.     No current facility-administered medications for this visit.    Allergies: Allergies  Allergen Reactions   Rosuvastatin  Rash   Arcalyst [Rilonacept]     Skin changes    I reviewed his past medical history, social history, family history, and environmental history and no significant changes have been reported from his previous visit.  Review of Systems  Constitutional:  Negative for appetite change, chills, fever and unexpected weight change.  HENT:  Negative for congestion and rhinorrhea.   Eyes:  Negative for itching.  Respiratory:  Negative for cough, chest tightness, shortness of breath and wheezing.   Cardiovascular:  Negative for chest pain.  Gastrointestinal:  Negative for abdominal pain.  Genitourinary:  Negative for difficulty urinating.  Skin:  Negative for rash.  Neurological:  Negative for headaches.    Objective: BP 138/86 (BP Location: Right Arm, Patient Position: Sitting, Cuff Size: Normal)   Pulse 78   Temp 98.6 F (37 C) (Temporal)   Resp 18   Ht 6' 0.44 (1.84 m)   Wt 186 lb (84.4 kg)   SpO2 98%   BMI 24.92 kg/m  Body mass index is 24.92 kg/m. Physical Exam Vitals and nursing note reviewed.  Constitutional:      Appearance: Normal appearance. Mark Powell is well-developed.  HENT:     Head: Normocephalic  and atraumatic.     Right Ear: Tympanic membrane and external ear normal.     Left Ear: Tympanic membrane and external ear normal.     Nose: Nose normal.     Mouth/Throat:     Mouth: Mucous membranes are moist.     Pharynx: Oropharynx is clear.  Eyes:     Conjunctiva/sclera: Conjunctivae normal.  Cardiovascular:     Rate and Rhythm: Normal rate and regular rhythm.     Heart sounds: Normal heart sounds. No murmur heard.    No friction rub. No gallop.  Pulmonary:     Effort: Pulmonary effort is normal.     Breath sounds: Normal breath sounds. No wheezing, rhonchi or rales.  Musculoskeletal:     Cervical back: Neck supple.  Skin:    General: Skin is warm.     Findings: No rash.  Neurological:     Mental  Status: Mark Powell is alert and oriented to person, place, and time.  Psychiatric:        Behavior: Behavior normal.    Previous notes and tests were reviewed. The plan was reviewed with the patient/family, and all questions/concerned were addressed.  It was my pleasure to see Mark Powell today and participate in his care. Please feel free to contact me with any questions or concerns.  Sincerely,  Orlan Cramp, DO Allergy  & Immunology  [facility]  [place] office: [phone] [facility] office: [phone]

## 2024-01-08 NOTE — Patient Instructions (Addendum)
 Allergic reaction Keep track of episodes. Slowly reintroduce red meat back into your diet. If you have issues let us  know and stop.  Limit alcohol intake.  Continue zyrtec  (cetirizine ) 10mg  once a day. If symptoms are not controlled or causes drowsiness let us  know. Continue Pepcid  (famotidine ) 20mg  once a day.  Avoid the following potential triggers: alcohol, tight clothing, NSAIDs, hot showers and getting overheated.  For mild symptoms you can take over the counter antihistamines (zyrtec  10mg  to 20mg ) and monitor symptoms closely.  If symptoms worsen or if you have severe symptoms including breathing issues, throat closure, significant swelling, whole body hives, severe diarrhea and vomiting, lightheadedness then use epinephrine  and seek immediate medical care afterwards. Emergency action plan in place.   Step down schedule - after 1 month Stop Pepcid  20mg . Continue zyrtec  10mg  once a day.   Regarding the cholesterol medication Ask your PCP if they can switch you to Lipitor. Let me know if you have any issues with this medication.   Follow up 3 months or sooner if needed.   Alpha-gal and Red Meat Allergy    Overview An allergy  to "alpha-gal" refers to having a severe and potentially life-threatening allergy  to a carbohydrate molecule called galactose-alpha-1,3-galactose that is found in most mammalian or "red meat". Unlike other food allergies which typically occur within minutes of ingestion, symptoms from eating red meat such as pork, lamb or beef may be delayed, occurring 3-8 hours after eating. Most food allergies are directed against a protein molecule, but alpha-gal is unusual because it is a carbohydrate, and a delay in its absorption may explain the delay in symptoms.  Helpful website: [employer]  What are the symptoms of an alpha-gal allergy ? As with other food allergies, signs or symptoms of an allergy  to alpha-gal may include: Hives and itching  Swelling  of your lips, face or eyelids  Shortness of breath, cough or wheezing  Abdominal pain, nausea, diarrhea or vomiting The most severe reaction, anaphylaxis, can present as a combination of several of these symptoms, may include low blood pressure, and is potentially fatal.  Because these symptoms are delayed, you may only wake up with them in the middle of the night after an evening meal.  How is an alpha-gal allergy  diagnosed? Diagnosis of this allergy  starts with your allergist taking an appropriate history and physical examination. Because the onset is usually quite delayed, it can be hard to associate the symptoms with eating red meat many hours previously. Triggers include any red meat - including beef, pork, lamb or even horse products. It may occur after eating hotdogs and hamburgers. In very rare cases the reaction may extend to milk or dairy proteins and gelatin.  Your allergist may recommend testing that includes skin tests to the relevant animal proteins and blood tests which measure the levels of a specific immunoglobulin E (IgE) antibody, to mammalian meats. An investigational blood test, IgE against alpha-gal itself, may also aid in the diagnosis.  How is an alpha-gal allergy  treated? Immediate symptoms such as hives or shortness of breath are treated the same as any other food allergy  - in an urgent care setting with anti-histamines, epinephrine  and other medications. Prevention long-term involves avoidance of all red meat in sensitized individuals. You may be advised to carry an epinephrine  auto-injector, to be used in case of subsequent accidental exposures and reaction. These measures do not necessarily mean switching to a full vegetarian diet, since poultry and fish can be consumed and do not cause similar reactions.  As with other food allergies, there is the possibility that over time the sensitivity diminishes - although these changes may take many years to become apparent.  How do  you become allergic to alpha-gal? Alpha-gal is a molecule carried in the saliva of the [place] tick and other potential arthropods typically after feeding on mammalian blood. People that are bitten by the tick, especially those that are bitten repeatedly, are at risk of becoming sensitized and producing the IgE necessary to then cause allergic reactions. Interestingly, allergic reactions may occur to red meat, to subsequent tick bites, and even to medications that contain alpha-gal. Cetuximab is a cancer medication that contains alpha-gal, and people who have had allergic reactions to this medication (these are typically immediate reactions, because it is infused intravenously) have a higher risk for red meat allergy  and are likely to have been bitten by ticks in the past. As might be expected, the incidence of tick bites is much higher in the [place] the traditional habitat for the tick. However, cases are now increasingly reported in the [place] and [place]. And it is a phenomenon that has been observed worldwide, with different ticks responsible for similar cases of red meat allergy  in many other countries such as [place], [place] and [place].  The discovery of this peculiar allergy  has allowed researchers to correlate tick bites with many cases of anaphylaxis that would previously have been classified as 'idiopathic', or of unknown cause. Also, while it was originally thought that the [employer] tick had to feast on mammalian blood in order to carry the alpha-gal molecule, more recent research has shown that it may carry this molecule and be capable of sensitizing humans independently.  How do you prevent an alpha-gal allergy ? Because this allergy  is predominantly tick born, you are more likely at risk if you often go outdoors in wooded areas for activities such as hiking, fishing or hunting. The key strategy is to prevent tick bites. This may include wearing long sleeved  shirts or pants, using appropriate insect repellants, and surveying for ticks after spending time outdoors. Any observed ticks should be removed carefully by cleaning the site with rubbing alcohol, then using tweezers to pull the tick's head up carefully from the skin using steady pressure. Clean your hands and the site one more time and make sure not to crush the tick between your fingers.

## 2024-02-04 ENCOUNTER — Encounter: Payer: Self-pay | Admitting: Family Medicine

## 2024-02-27 ENCOUNTER — Ambulatory Visit: Admitting: Physician Assistant

## 2024-05-08 ENCOUNTER — Telehealth: Payer: Self-pay

## 2024-05-08 NOTE — Telephone Encounter (Signed)
 [facility] letter has been faxed to [phone] and has been placed to bulk scanning.

## 2024-05-20 ENCOUNTER — Encounter: Payer: Self-pay | Admitting: Family Medicine

## 2024-05-20 ENCOUNTER — Ambulatory Visit: Admitting: Family Medicine

## 2024-05-20 VITALS — BP 142/79 | HR 80 | Ht 72.5 in | Wt 189.0 lb

## 2024-05-20 DIAGNOSIS — M79641 Pain in right hand: Secondary | ICD-10-CM

## 2024-05-20 DIAGNOSIS — Z0000 Encounter for general adult medical examination without abnormal findings: Secondary | ICD-10-CM

## 2024-05-20 DIAGNOSIS — R790 Abnormal level of blood mineral: Secondary | ICD-10-CM

## 2024-05-20 DIAGNOSIS — E7800 Pure hypercholesterolemia, unspecified: Secondary | ICD-10-CM

## 2024-05-20 DIAGNOSIS — J309 Allergic rhinitis, unspecified: Secondary | ICD-10-CM | POA: Diagnosis not present

## 2024-05-20 DIAGNOSIS — I10 Essential (primary) hypertension: Secondary | ICD-10-CM

## 2024-05-20 LAB — LIPID PANEL
Cholesterol: 216 mg/dL — ABNORMAL HIGH (ref 28–200)
HDL: 61.8 mg/dL
LDL Cholesterol: 142 mg/dL — ABNORMAL HIGH (ref 10–99)
NonHDL: 154.35
Total CHOL/HDL Ratio: 3
Triglycerides: 61 mg/dL (ref 10.0–149.0)
VLDL: 12.2 mg/dL (ref 0.0–40.0)

## 2024-05-20 LAB — COMPREHENSIVE METABOLIC PANEL WITH GFR
ALT: 180 U/L — ABNORMAL HIGH (ref 3–53)
AST: 107 U/L — ABNORMAL HIGH (ref 5–37)
Albumin: 4.4 g/dL (ref 3.5–5.2)
Alkaline Phosphatase: 41 U/L (ref 39–117)
BUN: 11 mg/dL (ref 6–23)
CO2: 30 meq/L (ref 19–32)
Calcium: 9.1 mg/dL (ref 8.4–10.5)
Chloride: 97 meq/L (ref 96–112)
Creatinine, Ser: 0.88 mg/dL (ref 0.40–1.50)
GFR: 98.29 mL/min
Glucose, Bld: 106 mg/dL — ABNORMAL HIGH (ref 70–99)
Potassium: 3.9 meq/L (ref 3.5–5.1)
Sodium: 136 meq/L (ref 135–145)
Total Bilirubin: 0.6 mg/dL (ref 0.2–1.2)
Total Protein: 7 g/dL (ref 6.0–8.3)

## 2024-05-20 LAB — CBC WITH DIFFERENTIAL/PLATELET
Basophils Absolute: 0.1 K/uL (ref 0.0–0.1)
Basophils Relative: 1.8 % (ref 0.0–3.0)
Eosinophils Absolute: 0.3 K/uL (ref 0.0–0.7)
Eosinophils Relative: 5.1 % — ABNORMAL HIGH (ref 0.0–5.0)
HCT: 42.4 % (ref 39.0–52.0)
Hemoglobin: 13.7 g/dL (ref 13.0–17.0)
Lymphocytes Relative: 23.6 % (ref 12.0–46.0)
Lymphs Abs: 1.5 K/uL (ref 0.7–4.0)
MCHC: 32.3 g/dL (ref 30.0–36.0)
MCV: 73.9 fl — ABNORMAL LOW (ref 78.0–100.0)
Monocytes Absolute: 1 K/uL (ref 0.1–1.0)
Monocytes Relative: 15.5 % — ABNORMAL HIGH (ref 3.0–12.0)
Neutro Abs: 3.5 K/uL (ref 1.4–7.7)
Neutrophils Relative %: 54 % (ref 43.0–77.0)
Platelets: 233 K/uL (ref 150.0–400.0)
RBC: 5.73 Mil/uL (ref 4.22–5.81)
RDW: 15.4 % (ref 11.5–15.5)
WBC: 6.4 K/uL (ref 4.0–10.5)

## 2024-05-20 MED ORDER — HYDROXYZINE HCL 25 MG PO TABS: 25.0000 mg | ORAL_TABLET | Freq: Three times a day (TID) | ORAL | 0 refills | Status: DC | PRN

## 2024-05-20 MED ORDER — CETIRIZINE HCL 10 MG PO TABS: 10.0000 mg | ORAL_TABLET | Freq: Every day | ORAL | 3 refills | Status: AC

## 2024-05-20 NOTE — Progress Notes (Signed)
 "  Complete physical exam  Patient: Mark Powell   DOB: 09/28/1970   53 y.o. Male  MRN: [id number]  Subjective:    Chief Complaint  Patient presents with   Annual Exam    Bharath Bernstein is a 53 y.o. male who presents today for a complete physical exam. He reports consuming a general diet. The patient does not participate in regular exercise at present. He generally feels well. He reports sleeping well. He does not have additional problems to discuss today.   Currently lives with: wife Acute concerns or interim problems since last visit:   Chronic Problems  Hypertension, idiopathic pericarditis: - Medications: losartan -hctz 50-12.5 mg daily - Compliance: good - Checking BP at home: no - Denies any SOB, recurrent headaches, CP, vision changes, LE edema, dizziness, palpitations, or medication side effects. - Diet: general  - Exercise: minimal  - Following with Dr. Barbaraann [facility]) TEE 04/08/21: EF 65-70%, moderate pleural effusion, mild ascending aortic aneurysm 3.5 cm,    He reports numbness and pain to is right lateral hand for about a month. He denies any injury to his wrist or elbow.. 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 He has not experienced radiation of symptoms beyond the local area of the hand. No recent neck injuries or arthritis. No swelling or skin changes.   Vision concerns: no Dental concerns: no STD concerns: no  ETOH use: 3-4 glasses of wine per day  Nicotine use: working on quitting  Recreational drugs/illegal substances: no   Most recent fall risk assessment:    05/20/2024    8:23 AM  Fall Risk   Falls in the past year? 0  Number falls in past yr: 0  Injury with Fall? 0  Risk for fall due to : No Fall Risks  Follow up Falls evaluation completed     Most recent depression screenings:    05/20/2024    8:23 AM 11/15/2023   11:50 AM  PHQ 2/9 Scores  PHQ - 2 Score 0 0  PHQ- 9  Score 1 2      Data saved with a previous flowsheet row definition            Patient Care Team: Almarie Waddell NOVAK, NP as PCP - General (Family Medicine) O'Neal, Darryle Ned, MD as PCP - Cardiology (Cardiology)   Show/hide medication list[1]  ROS All review of systems negative except what is listed in the HPI        Objective:     BP (!) 142/79   Pulse 80   Ht 6' 0.5 (1.842 m)   Wt 189 lb (85.7 kg)   SpO2 99%   BMI 25.28 kg/m    Physical Exam Vitals reviewed.  Constitutional:      General: He is not in acute distress.    Appearance: Normal appearance. He is not ill-appearing.  HENT:     Head: Normocephalic and atraumatic.     Right Ear: Tympanic membrane normal.     Left Ear: Tympanic membrane normal.     Nose: Nose normal.     Mouth/Throat:     Mouth: Mucous membranes are moist.     Pharynx: Oropharynx is clear.  Eyes:     Extraocular Movements: Extraocular movements intact.     Conjunctiva/sclera: Conjunctivae normal.     Pupils: Pupils are equal, round, and reactive to light.  Cardiovascular:     Rate and Rhythm: Normal rate  and regular rhythm.     Pulses: Normal pulses.     Heart sounds: Normal heart sounds.  Pulmonary:     Effort: Pulmonary effort is normal.     Breath sounds: Normal breath sounds.  Abdominal:     General: Abdomen is flat. Bowel sounds are normal. There is no distension.     Palpations: Abdomen is soft. There is no mass.     Tenderness: There is no abdominal tenderness. There is no right CVA tenderness, left CVA tenderness, guarding or rebound.  Genitourinary:    Comments: Deferred exam Musculoskeletal:        General: Normal range of motion.     Cervical back: Normal range of motion and neck supple. No tenderness.     Right lower leg: No edema.     Left lower leg: No edema.  Lymphadenopathy:     Cervical: No cervical adenopathy.  Skin:    General: Skin is warm and dry.     Capillary Refill: Capillary refill takes less  than 2 seconds.  Neurological:     General: No focal deficit present.     Mental Status: He is alert and oriented to person, place, and time. Mental status is at baseline.  Psychiatric:        Mood and Affect: Mood normal.        Behavior: Behavior normal.        Thought Content: Thought content normal.        Judgment: Judgment normal.          Assessment & Plan:    Routine Health Maintenance and Physical Exam Discussed health promotion and safety including diet and exercise recommendations, dental health, and injury prevention. Tobacco cessation if applicable. Seat belts, sunscreen, smoke detectors, etc.    Immunization History  Administered Date(s) Administered   Moderna Sars-Covid-2 Vaccination 10/21/2019, 11/18/2019    Health Maintenance  Topic Date Due   COVID-19 Vaccine (3 - 2025-26 season) 06/04/2024 (Originally 01/28/2024)   Zoster Vaccines- Shingrix (1 of 2) 08/18/2024 (Originally 01/14/2021)   Influenza Vaccine  08/26/2024 (Originally 12/28/2023)   DTaP/Tdap/Td (1 - Tdap) 05/20/2025 (Originally 01/14/1990)   Pneumococcal Vaccine: 50+ Years (1 of 2 - PCV) 05/20/2025 (Originally 01/14/1990)   Hepatitis B Vaccines 19-59 Average Risk (1 of 3 - 19+ 3-dose series) 05/20/2025 (Originally 01/14/1990)   Colonoscopy  09/22/2030   Hepatitis C Screening  Completed   HIV Screening  Completed   HPV VACCINES  Aged Out   Meningococcal B Vaccine  Aged Out        Problem List Items Addressed This Visit       Active Problems   HLD (hyperlipidemia) (Chronic)   Relevant Orders   Lipid panel (Completed)   HTN (hypertension) (Chronic)   Blood pressure elevated at 142/79 mmHg. On losartan  hydrochlorothiazide  50/12.5 mg. No recent home monitoring. - Start home blood pressure monitoring and maintain a log. - Schedule follow-up nurse visit in 2-3 weeks for blood pressure check.      Relevant Orders   Comprehensive metabolic panel with GFR (Completed)   Other Visit Diagnoses        Annual physical exam    -  Primary   Relevant Orders   CBC with Differential/Platelet (Completed)   Comprehensive metabolic panel with GFR (Completed)   Lipid panel (Completed)     Abnormal blood level of iron       Relevant Orders   CBC with Differential/Platelet (Completed)   Iron, TIBC and Ferritin  Panel     Pain of right hand     - Start Aleve  or ibuprofen  twice daily for two weeks if tolerated. - Apply heating pad to elbow and wrist. - Perform stretches  - Consider referral to sports medicine if symptoms persist.      Allergic rhinitis, unspecified seasonality, unspecified trigger       Relevant Medications   cetirizine  (ZYRTEC  ALLERGY ) 10 MG tablet   hydrOXYzine  (ATARAX ) 25 MG tablet        PATIENT COUNSELING:     Recommend that most people either abstain from alcohol or drink within safe limits (<=14/week and <=4 drinks/occasion for males, <=7/weeks and <= 3 drinks/occasion for females) and that the risk for alcohol disorders and other health effects rises proportionally with the number of drinks per week and how often a drinker exceeds daily limits.   Diet: Recommend to adjust caloric intake to maintain or achieve ideal body weight, to reduce intake of dietary saturated fat and total fat, to limit sodium intake by avoiding high sodium foods and not adding table salt, and to maintain adequate dietary potassium and calcium  preferably from fresh fruits, vegetables, and low-fat dairy products.   Emphasized the importance of regular exercise.  Injury prevention: Recommend seatbelts, safety helmets, smoke detector, etc..   Dental health: Recommend regular tooth brushing, flossing, and dental visits.       Return in about 2 weeks (around 06/03/2024) for BP check with nurse.     Waddell KATHEE Mon, NP  I,Emily Lagle,acting as a scribe for Waddell KATHEE Mon, NP.,have documented all relevant documentation on the behalf of Waddell KATHEE Mon, NP.  I, Waddell KATHEE Mon, NP, have reviewed  all documentation for this visit. The documentation on 05/20/2024 for the exam, diagnosis, procedures, and orders are all accurate and complete.     [1]  Outpatient Medications Prior to Visit  Medication Sig   EPINEPHrine  0.3 mg/0.3 mL IJ SOAJ injection Inject 0.3 mg into the muscle as needed for anaphylaxis.   famotidine  (PEPCID ) 20 MG tablet Take 1 tablet (20 mg total) by mouth 2 (two) times daily.   losartan -hydrochlorothiazide  (HYZAAR) 50-12.5 MG tablet Take 1 tablet by mouth daily.   [DISCONTINUED] cetirizine  (ZYRTEC  ALLERGY ) 10 MG tablet Take 1 tablet (10 mg total) by mouth daily.   [DISCONTINUED] hydrOXYzine  (ATARAX ) 25 MG tablet Take 1 tablet (25 mg total) by mouth every 8 (eight) hours as needed.   No facility-administered medications prior to visit.   "

## 2024-05-20 NOTE — Assessment & Plan Note (Signed)
 Blood pressure elevated at 142/79 mmHg. On losartan  hydrochlorothiazide  50/12.5 mg. No recent home monitoring. - Start home blood pressure monitoring and maintain a log. - Schedule follow-up nurse visit in 2-3 weeks for blood pressure check.

## 2024-05-21 ENCOUNTER — Ambulatory Visit: Payer: Self-pay | Admitting: Family Medicine

## 2024-05-21 DIAGNOSIS — R7989 Other specified abnormal findings of blood chemistry: Secondary | ICD-10-CM

## 2024-05-21 DIAGNOSIS — R748 Abnormal levels of other serum enzymes: Secondary | ICD-10-CM

## 2024-05-21 LAB — IRON,TIBC AND FERRITIN PANEL
%SAT: 33 % (ref 20–48)
Ferritin: 1104 ng/mL — ABNORMAL HIGH (ref 38–380)
Iron: 98 ug/dL (ref 50–180)
TIBC: 298 ug/dL (ref 250–425)

## 2024-05-30 ENCOUNTER — Other Ambulatory Visit

## 2024-05-30 ENCOUNTER — Ambulatory Visit
Admission: RE | Admit: 2024-05-30 | Discharge: 2024-05-30 | Disposition: A | Discharge status: Home / Self Care | Source: Ambulatory Visit | Attending: Family Medicine | Admitting: Family Medicine

## 2024-05-30 DIAGNOSIS — R790 Abnormal level of blood mineral: Secondary | ICD-10-CM | POA: Diagnosis not present

## 2024-05-30 DIAGNOSIS — R748 Abnormal levels of other serum enzymes: Secondary | ICD-10-CM | POA: Diagnosis not present

## 2024-05-30 DIAGNOSIS — R7989 Other specified abnormal findings of blood chemistry: Secondary | ICD-10-CM

## 2024-05-30 LAB — IBC + FERRITIN
Ferritin: 848.3 ng/mL — ABNORMAL HIGH (ref 22.0–322.0)
Iron: 145 ug/dL (ref 42–165)
Saturation Ratios: 43.9 % (ref 20.0–50.0)
TIBC: 330.4 ug/dL (ref 250.0–450.0)
Transferrin: 236 mg/dL (ref 212.0–360.0)

## 2024-06-06 LAB — HEMOCHROMATOSIS DNA-PCR(C282Y,H63D)

## 2024-06-09 ENCOUNTER — Ambulatory Visit
Admission: EM | Admit: 2024-06-09 | Discharge: 2024-06-09 | Disposition: A | Discharge status: Home / Self Care | Attending: Nurse Practitioner | Admitting: Nurse Practitioner

## 2024-06-09 DIAGNOSIS — S61208A Unspecified open wound of other finger without damage to nail, initial encounter: Secondary | ICD-10-CM | POA: Diagnosis not present

## 2024-06-09 DIAGNOSIS — Z23 Encounter for immunization: Secondary | ICD-10-CM | POA: Diagnosis not present

## 2024-06-09 DIAGNOSIS — S6992XA Unspecified injury of left wrist, hand and finger(s), initial encounter: Secondary | ICD-10-CM | POA: Diagnosis not present

## 2024-06-09 MED ORDER — TETANUS-DIPHTH-ACELL PERTUSSIS 5-2-15.5 LF-MCG/0.5 IM SUSP
0.5000 mL | Freq: Once | INTRAMUSCULAR | Status: AC
  Administered 2024-06-09: 0.5 mL via INTRAMUSCULAR

## 2024-06-09 NOTE — ED Triage Notes (Signed)
 Pt states he cut his left middle finger with a bread knife making dinner last night.   Pt states he does not remember when his last tetanus shot was.

## 2024-06-09 NOTE — ED Provider Notes (Addendum)
 " [facility]    CSN: [id number] Arrival date & time: 06/09/24  0947      History   Chief Complaint Chief Complaint  Patient presents with   Laceration    HPI Mark Powell is a 54 y.o. male.   The history is provided by the patient.   Patient presents with a laceration to the left middle finger that occurred last evening while he was preparing dinner.  Patient states that he cut the left middle finger with a bread knife.  Denies numbness, tingling, radiation of pain, or foul-smelling drainage from the site.  Patient reports that he is unsure of when he had his last tetanus shot.  Patient reports that he is right-hand dominant.  Past Medical History:  Diagnosis Date   High cholesterol    Hypertension    Pericarditis 2018    Patient Active Problem List   Diagnosis Date Noted   Cigarette nicotine dependence without complication 11/15/2023   Alcohol use 11/15/2023   Pericarditis 04/07/2021   Hypokalemia 10/12/2016   Acute pericarditis 09/27/2016   Chest pain 09/26/2016   HLD (hyperlipidemia) 09/26/2016   HTN (hypertension) 09/26/2016   Chest pain with high risk for cardiac etiology     Past Surgical History:  Procedure Laterality Date   arm surgery     LEFT HEART CATH AND CORONARY ANGIOGRAPHY N/A 09/26/2016   Procedure: Left Heart Cath and Coronary Angiography;  Surgeon: Debby DELENA Sor, MD;  Location: [facility];  Service: Cardiovascular;  Laterality: N/A;   TONSILLECTOMY         Home Medications    Prior to Admission medications  Medication Sig Start Date End Date Taking? Authorizing Provider  cetirizine  (ZYRTEC  ALLERGY ) 10 MG tablet Take 1 tablet (10 mg total) by mouth daily. 05/20/24 05/15/25 Yes Almarie Waddell NOVAK, NP  losartan -hydrochlorothiazide  (HYZAAR) 50-12.5 MG tablet Take 1 tablet by mouth daily.   Yes [provider]  EPINEPHrine  0.3 mg/0.3 mL IJ SOAJ injection Inject 0.3 mg into the muscle as needed for anaphylaxis. 12/03/23    Hoy Nidia FALCON, PA-C  famotidine  (PEPCID ) 20 MG tablet Take 1 tablet (20 mg total) by mouth 2 (two) times daily. 12/13/23   Luke Orlan HERO, DO  hydrOXYzine  (ATARAX ) 25 MG tablet Take 1 tablet (25 mg total) by mouth every 8 (eight) hours as needed. 05/20/24   Almarie Waddell NOVAK, NP    Family History Family History  Problem Relation Age of Onset   Heart attack Brother 80    Social History Social History[1]   Allergies   Rosuvastatin  and Arcalyst [rilonacept]   Review of Systems Review of Systems Per HPI  Physical Exam Triage Vital Signs ED Triage Vitals  Encounter Vitals Group     BP 06/09/24 1007 (!) 168/104     Girls Systolic BP Percentile --      Girls Diastolic BP Percentile --      Boys Systolic BP Percentile --      Boys Diastolic BP Percentile --      Pulse Rate 06/09/24 1007 77     Resp 06/09/24 1007 16     Temp 06/09/24 1007 98 F (36.7 C)     Temp Source 06/09/24 1007 Oral     SpO2 06/09/24 1007 97 %     Weight --      Height --      Head Circumference --      Peak Flow --      Pain  Score 06/09/24 1011 1     Pain Loc --      Pain Education --      Exclude from Growth Chart --    No data found.  Updated Vital Signs BP (!) 162/95 (BP Location: Right Arm)   Pulse 77   Temp 98 F (36.7 C) (Oral)   Resp 16   SpO2 97%   Visual Acuity Right Eye Distance:   Left Eye Distance:   Bilateral Distance:    Right Eye Near:   Left Eye Near:    Bilateral Near:     Physical Exam Vitals and nursing note reviewed.  Constitutional:      General: He is not in acute distress.    Appearance: Normal appearance.  HENT:     Head: Normocephalic.  Eyes:     Extraocular Movements: Extraocular movements intact.     Pupils: Pupils are equal, round, and reactive to light.  Cardiovascular:     Rate and Rhythm: Normal rate and regular rhythm.     Pulses: Normal pulses.     Heart sounds: Normal heart sounds.  Pulmonary:     Effort: Pulmonary effort is normal.      Breath sounds: Normal breath sounds.  Musculoskeletal:     Cervical back: Normal range of motion.  Skin:    General: Skin is warm and dry.     Findings: Signs of injury present.  Neurological:     General: No focal deficit present.     Mental Status: He is alert and oriented to person, place, and time.  Psychiatric:        Mood and Affect: Mood normal.        Behavior: Behavior normal.      UC Treatments / Results  Labs (all labs ordered are listed, but only abnormal results are displayed) Labs Reviewed - No data to display  EKG   Radiology No results found.  Procedures Procedures (including critical care time)  Medications Ordered in UC Medications  Tdap (ADACEL) injection 0.5 mL (0.5 mLs Intramuscular Given 06/09/24 1022)    Initial Impression / Assessment and Plan / UC Course  I have reviewed the triage vital signs and the nursing notes.  Pertinent labs & imaging results that were available during my care of the patient were reviewed by me and considered in my medical decision making (see chart for details).  Patient with a laceration to the left middle finger that occurred last evening while making dinner.  On exam, the patient has completely avulsed the skin in the mid distal tip of the finger pad of the left middle finger.  Bleeding is controlled at this time.  The area is not amenable to repair at this time.  Antibiotic ointment dressing was applied along with a pressure dressing to help with bleeding.  Tdap was updated.  Recheck of the patient's blood pressure at discharge was improved at 162/95.  Supportive care recommendations were provided and discussed with the patient to include over-the-counter analgesics, keeping the area clean and dry, and to monitor for signs of infection.  Patient was given strict follow-up precautions.  Patient was in agreement with this plan of care and verbalizes understanding.  All questions were answered.  Patient stable for  discharge.  Final Clinical Impressions(s) / UC Diagnoses   Final diagnoses:  Avulsion of skin of middle finger, initial encounter     Discharge Instructions      Your Tdap was updated today.  He is  good for the next 10 years. A dressing has been applied.  Do not submerge the left middle finger in water for the next 24 hours.  You may change the dressing tomorrow. Cleanse the affected area daily with warm soap and water.  You may also apply over-the-counter antibiotic ointment.  Cover the area when you are at work or out in public.  You may leave the area open to air 2 to 3 hours daily to help promote healing. If you have pets, do not allow them to lick the wound. Monitor for signs of infection.  Seek care if you develop foul-smelling drainage from the site, increased redness, swelling, or other concerns. Follow-up as needed.     ED Prescriptions   None    PDMP not reviewed this encounter.    Gilmer Etta PARAS, NP 06/09/24 1053     [1]  Social History Tobacco Use   Smoking status: Some Days    Current packs/day: 0.25    Average packs/day: 0.3 packs/day for 34.0 years (8.5 ttl pk-yrs)    Types: Cigarettes   Smokeless tobacco: Never   Tobacco comments:    07/11/2023 Patient smokes occasionally    12/13/2023 smokes  2 pack a day since age 39  Substance Use Topics   Alcohol use: Not Currently    Comment: bottle of wine a day   Drug use: Not Currently    Comment: smokes THC pens     Leath-Warren, Etta PARAS, NP 06/09/24 1054  "

## 2024-06-09 NOTE — Discharge Instructions (Addendum)
 Your Tdap was updated today.  He is good for the next 10 years. A dressing has been applied.  Do not submerge the left middle finger in water for the next 24 hours.  You may change the dressing tomorrow. Cleanse the affected area daily with warm soap and water.  You may also apply over-the-counter antibiotic ointment.  Cover the area when you are at work or out in public.  You may leave the area open to air 2 to 3 hours daily to help promote healing. If you have pets, do not allow them to lick the wound. Monitor for signs of infection.  Seek care if you develop foul-smelling drainage from the site, increased redness, swelling, or other concerns. Follow-up as needed.

## 2024-06-11 ENCOUNTER — Ambulatory Visit: Payer: Self-pay | Admitting: Family Medicine

## 2024-06-11 DIAGNOSIS — R7989 Other specified abnormal findings of blood chemistry: Secondary | ICD-10-CM

## 2024-06-11 DIAGNOSIS — R748 Abnormal levels of other serum enzymes: Secondary | ICD-10-CM

## 2024-06-19 ENCOUNTER — Other Ambulatory Visit: Payer: Self-pay | Admitting: Family Medicine

## 2024-06-19 DIAGNOSIS — J309 Allergic rhinitis, unspecified: Secondary | ICD-10-CM
